# Patient Record
Sex: Female | Born: 1935 | Race: White | Hispanic: No | Marital: Married | Smoking: Never smoker
Health system: Southern US, Community
[De-identification: ages and names within clinical notes are randomized; demographics above are authoritative.]

## PROBLEM LIST (undated history)

## (undated) DIAGNOSIS — M810 Age-related osteoporosis without current pathological fracture: Secondary | ICD-10-CM

## (undated) DIAGNOSIS — H269 Unspecified cataract: Secondary | ICD-10-CM

## (undated) DIAGNOSIS — E785 Hyperlipidemia, unspecified: Secondary | ICD-10-CM

## (undated) DIAGNOSIS — I359 Nonrheumatic aortic valve disorder, unspecified: Secondary | ICD-10-CM

## (undated) DIAGNOSIS — M1990 Unspecified osteoarthritis, unspecified site: Secondary | ICD-10-CM

## (undated) DIAGNOSIS — K5730 Diverticulosis of large intestine without perforation or abscess without bleeding: Secondary | ICD-10-CM

## (undated) DIAGNOSIS — I10 Essential (primary) hypertension: Secondary | ICD-10-CM

## (undated) HISTORY — DX: Diverticulosis of large intestine without perforation or abscess without bleeding: K57.30

## (undated) HISTORY — DX: Nonrheumatic aortic valve disorder, unspecified: I35.9

## (undated) HISTORY — DX: Hyperlipidemia, unspecified: E78.5

## (undated) HISTORY — DX: Unspecified cataract: H26.9

## (undated) HISTORY — PX: HIP FRACTURE SURGERY: SHX118

## (undated) HISTORY — DX: Age-related osteoporosis without current pathological fracture: M81.0

## (undated) HISTORY — DX: Unspecified osteoarthritis, unspecified site: M19.90

---

## 2004-08-15 ENCOUNTER — Encounter: Payer: Self-pay | Admitting: Cardiology

## 2004-11-07 ENCOUNTER — Encounter: Payer: Self-pay | Admitting: Cardiology

## 2007-09-04 ENCOUNTER — Encounter: Payer: Self-pay | Admitting: Cardiology

## 2010-01-03 ENCOUNTER — Encounter: Payer: Self-pay | Admitting: Cardiology

## 2010-01-11 DIAGNOSIS — I359 Nonrheumatic aortic valve disorder, unspecified: Secondary | ICD-10-CM | POA: Insufficient documentation

## 2010-01-11 DIAGNOSIS — H269 Unspecified cataract: Secondary | ICD-10-CM

## 2010-01-11 DIAGNOSIS — K5730 Diverticulosis of large intestine without perforation or abscess without bleeding: Secondary | ICD-10-CM | POA: Insufficient documentation

## 2010-01-11 DIAGNOSIS — M810 Age-related osteoporosis without current pathological fracture: Secondary | ICD-10-CM | POA: Insufficient documentation

## 2010-01-11 DIAGNOSIS — I10 Essential (primary) hypertension: Secondary | ICD-10-CM | POA: Insufficient documentation

## 2010-01-11 DIAGNOSIS — M1990 Unspecified osteoarthritis, unspecified site: Secondary | ICD-10-CM | POA: Insufficient documentation

## 2010-01-11 DIAGNOSIS — E785 Hyperlipidemia, unspecified: Secondary | ICD-10-CM | POA: Insufficient documentation

## 2010-01-12 ENCOUNTER — Ambulatory Visit: Payer: Self-pay | Admitting: Cardiology

## 2010-01-12 DIAGNOSIS — R0602 Shortness of breath: Secondary | ICD-10-CM | POA: Insufficient documentation

## 2010-01-24 ENCOUNTER — Encounter (HOSPITAL_COMMUNITY)
Admission: RE | Admit: 2010-01-24 | Discharge: 2010-04-07 | Payer: Self-pay | Source: Home / Self Care | Attending: Cardiology | Admitting: Cardiology

## 2010-01-24 ENCOUNTER — Encounter: Payer: Self-pay | Admitting: Cardiology

## 2010-01-24 ENCOUNTER — Ambulatory Visit: Payer: Self-pay | Admitting: Cardiology

## 2010-01-24 ENCOUNTER — Ambulatory Visit: Payer: Self-pay

## 2010-01-24 ENCOUNTER — Ambulatory Visit (HOSPITAL_COMMUNITY): Admission: RE | Admit: 2010-01-24 | Discharge: 2010-01-24 | Payer: Self-pay | Admitting: Cardiology

## 2010-03-26 ENCOUNTER — Inpatient Hospital Stay (HOSPITAL_COMMUNITY): Admission: EM | Admit: 2010-03-26 | Discharge: 2010-03-31 | Payer: Self-pay | Source: Home / Self Care

## 2010-03-27 ENCOUNTER — Encounter (INDEPENDENT_AMBULATORY_CARE_PROVIDER_SITE_OTHER): Payer: Self-pay

## 2010-04-30 NOTE — Consult Note (Signed)
NAMEALLISON, [name]              ACCOUNT NO.:  [id number]  MEDICAL RECORD NO.:  [id number]          PATIENT TYPE:  INP  LOCATION:  3728                         FACILITY:  [facility]  PHYSICIAN:  Valetta Fuller, M.D.  DATE OF BIRTH:  [birth year]  DATE OF CONSULTATION:  03/27/2010 DATE OF DISCHARGE:                                CONSULTATION   CHIEF COMPLAINT:  Hydronephrosis, bladder thickening, and pelvis floor prolapse seen on CT.  HISTORY OF PRESENT ILLNESS:  This is an 77 year old female that was admitted on March 26, 2010 with a history of diverticular bleeding 4- 5 years ago prior to moving to [place].  She has a history of paroxysmal atrial fibrillation but is not on Coumadin.  She presented to the emergency room with 2 episodes of bright red blood per rectum without stools.  She denies any abdominal pain, cramping with her stools.  States that her stools have been normal lately.  She denies chest pain, shortness of breath, headache, dizziness, fever, or urinary voiding symptoms.  She has had 2 colonoscopies in the past, last one was 3 years ago and found to be normal per the patient.  PAST MEDICAL HISTORY: 1. Paroxysmal atrial fibrillation.  She is followed by Dr. Jens Som.     The patient is not on Coumadin but does take aspirin daily. 2. Diverticulitis. 3. Diverticular bleed. 4. Hypertension. 5. Hyperlipidemia.  PAST SURGICAL HISTORY:  Significant for knee surgery.  HOME MEDICATIONS:  Aspirin, amlodipine, and pravastatin.  ALLERGIES:  She denies any drug allergies.  REVIEW OF SYSTEMS:  Significant only for the rectal bleeding on admission to the emergency room.  All other systems were reviewed with the patient and were negative.  SOCIAL HISTORY:  Negative for tobacco, negative for drug.  She does drink two glasses of wine daily.  She denies any exposure to tobacco or chemical exposure.  She lives at home and cares for her husband and she is a full  code.  FAMILY HISTORY:  Significant for colon cancer in her mother and she has two sons and a granddaughter with Crohn's.  PHYSICAL EXAMINATION:  VITAL SIGNS:  Blood pressure is 111/71, heart rate 64, respirations 18, temperature 98.4, and room air sats 98%. GENERAL:  Thin, well-developed white elderly female in no acute distress. HEENT:  Atraumatic and normocephalic. NECK:  Supple.  No masses or JVD. CHEST:  Normal. LUNGS:  Clear to auscultation. HEART:  Irregularly regular rhythm.  She has a 2-3 grade/6 systolic murmur. ABDOMEN:  Soft, nontender, positive bowel sounds x4 without CVA tenderness. EXTREMITIES:  No clubbing, cyanosis, or edema. GU: Pelvic organ prolapse. RECTAL:  Deferred. SKIN:  Normal to inspection.  LABORATORY DATA:  CBC:  WBC is 6.6, hemoglobin is 10.0, hematocrit 30.6, and platelets 244.  BMET: Sodium is 139, potassium 3.6, chloride 104, CO2 is 25, BUN is 10, creatinine 0.6, and glucose is 89.  CT abdomen and pelvis shows extensive diffuse diverticulosis involving the entirety of the colon with minimal associated inflammation of proximal sigmoid colon involving a few diverticula, could represent minimal diverticulitis.  Large stool-filled 2.4 cm diverticulum at the distal sigmoid  colon demonstrates mild information, apparent pelvic floor prolapse with protrusion of the periurethral structures, mildly irregular thickening at the bladder base of the bladder with bilateral hydronephrosis, left greater than right raises questions for malignancy.  ASSESSMENT AND PLAN: 1. Pelvic organ prolapse.  Attempt to replace bladder during vaginal     exam without difficulty, I was unsure if this was going to stay     once the patient stands or strains for bowel movement.  Recommend     gynecologist place a pessary for pelvic floor support. 2. Bilateral hydronephrosis, left greater than right.  Recommend     follow up in our office with Dr. Isabel Caprice with a renal  ultrasound. 3. Irregular thickening in bladder base.  We will send urine for     cytology and follow up in our office for flexible cystoscopy with     Dr. Isabel Caprice.     Jetta Lout, NP   ______________________________ Valetta Fuller, M.D.    DW/MEDQ  D:  03/27/2010  T:  03/28/2010  Job:  [id number]  Electronically Signed by Jetta Lout NP on 03/28/2010 01:19:38 PM Electronically Signed by Barron Alvine M.D. on 04/30/2010 09:20:57 AM

## 2010-05-08 NOTE — Letter (Signed)
Summary: [facility]  [facility]   Imported By: Marylou Mccoy 01/26/2010 14:41:16  _____________________________________________________________________  External Attachment:    Type:   Image     Comment:   External Document

## 2010-05-08 NOTE — Assessment & Plan Note (Signed)
Summary: Cardiology Nuclear Testing  Nuclear Med Background Indications for Stress Test: Evaluation for Ischemia   History: Echo  History Comments: Mild AS  Symptoms: DOE, Palpitations, SOB    Nuclear Pre-Procedure Cardiac Risk Factors: Family History - CAD, Hypertension, Lipids Caffeine/Decaff Intake: None NPO After: 6:30 PM Lungs: clear IV 0.9% NS with Angio Cath: 22g     IV Site: R Antecubital IV Started by: Bonnita Levan, RN Chest Size (in) 34     Cup Size B     Height (in): 64 Weight (lb): 120 BMI: 20.67 Tech Comments: In recovery this patient had a hypotensive episode from the [place]. Pt placed in trendelenburg for 5 minutes and her blood pressure came up to 115/48.  Nuclear Med Study 1 or 2 day study:  1 day     Stress Test Type:  Eugenie Birks Reading MD:  Willa Rough, MD     Referring MD:  B.Crenshaw Resting Radionuclide:  Technetium 82m Tetrofosmin     Resting Radionuclide Dose:  11 mCi  Stress Radionuclide:  Technetium 27m Tetrofosmin     Stress Radionuclide Dose:  33 mCi   Stress Protocol   Lexiscan: 0.4 mg   Stress Test Technologist:  Milana Na, EMT-P     Nuclear Technologist:  Doyne Keel, CNMT  Rest Procedure  Myocardial perfusion imaging was performed at rest 45 minutes following the intravenous administration of Technetium 67m Tetrofosmin.  Stress Procedure  The patient received IV Lexiscan 0.4 mg over 15-seconds.  Technetium 46m Tetrofosmin injected at 30-seconds.  There were no significant changes and rare pacs/pvcs with infusion.  Quantitative spect images were obtained after a 45 minute delay.  QPS Raw Data Images:  Patient motion noted; appropriate software correction applied. Stress Images:  Normal homogeneous uptake in all areas of the myocardium. Rest Images:  Normal homogeneous uptake in all areas of the myocardium. Subtraction (SDS):  No evidence of ischemia. Transient Ischemic Dilatation:  1.10  (Normal <1.22)  Lung/Heart Ratio:  0.34   (Normal <0.45)  Quantitative Gated Spect Images QGS EDV:  86 ml QGS ESV:  25 ml QGS EF:  71 % QGS cine images:  Wall motion normal.  Findings Normal nuclear study      Overall Impression  Exercise Capacity: Lexiscan with no exercise. BP Response: There was hypotension related to Lexiscan.  Clinical Symptoms: Light-headed when BP down...resolved completely ECG Impression: No significant ST segment change suggestive of ischemia. Overall Impression: Normal stress nuclear study. Hypotension from Lexiscan resolved.  Appended Document: Cardiology Nuclear Testing ok  Appended Document: Cardiology Nuclear Testing pt aware of results

## 2010-05-08 NOTE — Letter (Signed)
Summary: [facility] - Echo  [facility] - Echo   Imported By: Marylou Mccoy 01/26/2010 14:42:43  _____________________________________________________________________  External Attachment:    Type:   Image     Comment:   External Document

## 2010-05-08 NOTE — Assessment & Plan Note (Signed)
Summary: NP6/MVP/INCREASED IN SOB   CC:  sob.  History of Present Illness: 77 year old female for evaluation of dyspnea. Patient apparently had an echocardiogram in May of 2009. This showed normal LV function. The aortic bowel was calcified and there was mild aortic stenosis with a peak velocity of 2.2 m/s and a mean gradient of 13 mm of mercury. There was mild mitral regurgitation. There was grade 1 diastolic dysfunction. Because of her dyspnea we were asked to further evaluate. The patient has dyspnea on exertion for the past year. It is relieved with rest. There is no orthopnea, PND, palpitations, syncope or chest pain. She occasionally has mild edema in the left lower extremity which is a chronic issue.  Current Medications (verified): 1)  Amlodipine Besylate 10 Mg Tabs (Amlodipine Besylate) .... Take One Tablet By Mouth Daily 2)  Cod Liver Oil  Oil (Cod Liver Oil) .... Daily 3)  A Thru Z Advanced  Tabs (Multiple Vitamins-Minerals) .Marland Kitchen.. 1 Tab By Mouth Once Daily 4)  Calcium 600-D 600-400 Mg-Unit Tabs (Calcium Carbonate-Vitamin D) .Marland Kitchen.. 1 Tab By Mouth Once Daily 5)  Glucosamine-Chondroitin   Caps (Glucosamine-Chondroit-Vit C-Mn) .Marland Kitchen.. 1 Tab By Mouth Once Daily  Past History:  Past Medical History: AORTIC STENOSIS  BORDERLINE HYPERLIPIDEMIA HYPERTENSION CATARACTS DIVERTICULAR DISEASE OSTEOARTHRITIS OSTEOPOROSIS   Past Surgical History: Knee replacement (left) Cataract Extraction Tonsillectomy  Family History: Reviewed history from 01/11/2010 and no changes required. Mother: Colon cancer Brother(1) lung cancer Brother(2) Brain Tumor Son died of MI at age 63  Social History: Reviewed history from 01/11/2010 and no changes required. Married  Alcohol Use - 2 glasses of wine per day Drug Use - no  Review of Systems       no fevers or chills, productive cough, hemoptysis, dysphasia, odynophagia, melena, hematochezia, dysuria, hematuria, rash, seizure activity, orthopnea, PND,  pedal edema, claudication. Remaining systems are negative.   Vital Signs:  Patient profile:   76 year old female Height:      64 inches Weight:      118 pounds BMI:     20.33 Pulse rate:   83 / minute Resp:     14 per minute BP sitting:   159 / 68  (left arm)  Vitals Entered By: Kem Parkinson (January 12, 2010 10:33 AM)  Physical Exam  General:  Well developed/well nourished in NAD Skin warm/dry Patient not depressed No peripheral clubbing Back-normal HEENT-normal/normal eyelids Neck supple/normal carotid upstroke bilaterally; no bruits; no JVD; no thyromegaly chest - CTA/ normal expansion CV - RRR/normal S1 and S2; no  rubs or gallops;  PMI nondisplaced, 2/6 systolic murmur left sternal border. S2 is not diminished. Abdomen -NT/ND, no HSM, no mass, + bowel sounds, no bruit 2+ femoral pulses, no bruits Ext-no edema, chords, 2+ DP Neuro-grossly nonfocal     EKG  Procedure date:  01/12/2010  Findings:      catheter sinus rhythm at a rate of 83. Left axis deviation. No significant ST changes. Poor R-wave progression.  Impression & Recommendations:  Problem # 1:  AORTIC STENOSIS (ICD-424.1)  Etiology of dyspnea is not clear. She is not volume overloaded on examination. Her aortic stenosis does not sound severe on examination. Schedule echocardiogram to quantify LV function and aortic stenosis. She may require aortic valve replacement in the future. Add enteric-coated aspirin 81 mg p.o. daily.  Orders: Echocardiogram (Echo)  Problem # 2:  SHORTNESS OF BREATH (ICD-786.05) As above schedule echocardiogram. We'll also schedule Myoview to exclude ischemia. Her updated medication  list for this problem includes:    Amlodipine Besylate 10 Mg Tabs (Amlodipine besylate) .Marland Kitchen... Take one tablet by mouth daily  Orders: Nuclear Stress Test (Nuc Stress Test)  Problem # 3:  HYPERLIPIDEMIA (ICD-272.4) Will have recent lipids forwarded to [place] for records. She may require a  statin given her aortic stenosis if LDL significantly elevated.  Problem # 4:  HYPERTENSION (ICD-401.9) Blood pressure mildly elevated. I will follow this and increase medications as needed. Her updated medication list for this problem includes:    Amlodipine Besylate 10 Mg Tabs (Amlodipine besylate) .Marland Kitchen... Take one tablet by mouth daily  Patient Instructions: 1)  Your physician recommends that you schedule a follow-up appointment in: 6 MONTHS 2)  Your physician has requested that you have an echocardiogram.  Echocardiography is a painless test that uses sound waves to create images of your heart. It provides your doctor with information about the size and shape of your heart and how well your heart's chambers and valves are working.  This procedure takes approximately one hour. There are no restrictions for this procedure. 3)  Your physician has requested that you have an exercise stress myoview.  For further information please visit [email or web address]  Please follow instruction sheet, as given.

## 2010-05-08 NOTE — Consult Note (Signed)
Summary: [facility]   Imported By: Earl Many 03/12/2010 16:49:06  _____________________________________________________________________  External Attachment:    Type:   Image     Comment:   Consult Report

## 2010-05-08 NOTE — Letter (Signed)
Summary: [facility] - Echo  [facility] - Echo   Imported By: Marylou Mccoy 01/26/2010 14:42:05  _____________________________________________________________________  External Attachment:    Type:   Image     Comment:   External Document

## 2010-05-24 ENCOUNTER — Inpatient Hospital Stay (HOSPITAL_COMMUNITY)
Admission: EM | Admit: 2010-05-24 | Discharge: 2010-05-29 | DRG: 481 | Disposition: A | Discharge status: Skilled Nursing Facility | Payer: Medicare Other | Attending: Orthopedic Surgery | Admitting: Orthopedic Surgery

## 2010-05-24 ENCOUNTER — Inpatient Hospital Stay (HOSPITAL_COMMUNITY): Payer: Medicare Other

## 2010-05-24 ENCOUNTER — Emergency Department (HOSPITAL_COMMUNITY): Payer: Medicare Other

## 2010-05-24 DIAGNOSIS — W06XXXA Fall from bed, initial encounter: Secondary | ICD-10-CM | POA: Diagnosis present

## 2010-05-24 DIAGNOSIS — I10 Essential (primary) hypertension: Secondary | ICD-10-CM | POA: Diagnosis present

## 2010-05-24 DIAGNOSIS — S72143A Displaced intertrochanteric fracture of unspecified femur, initial encounter for closed fracture: Principal | ICD-10-CM | POA: Diagnosis present

## 2010-05-24 DIAGNOSIS — Y998 Other external cause status: Secondary | ICD-10-CM

## 2010-05-24 DIAGNOSIS — N1330 Unspecified hydronephrosis: Secondary | ICD-10-CM | POA: Diagnosis present

## 2010-05-24 DIAGNOSIS — Y92009 Unspecified place in unspecified non-institutional (private) residence as the place of occurrence of the external cause: Secondary | ICD-10-CM

## 2010-05-24 DIAGNOSIS — S78119A Complete traumatic amputation at level between unspecified hip and knee, initial encounter: Secondary | ICD-10-CM

## 2010-05-24 LAB — CBC
Hemoglobin: 12.9 g/dL (ref 12.0–15.0)
MCH: 31.9 pg (ref 26.0–34.0)
Platelets: 244 10*3/uL (ref 150–400)
RBC: 4.05 MIL/uL (ref 3.87–5.11)
RDW: 12.6 % (ref 11.5–15.5)
WBC: 5.9 10*3/uL (ref 4.0–10.5)

## 2010-05-24 LAB — TYPE AND SCREEN
ABO/RH(D): A POS
Antibody Screen: NEGATIVE

## 2010-05-24 LAB — BASIC METABOLIC PANEL
CO2: 28 mEq/L (ref 19–32)
Calcium: 9 mg/dL (ref 8.4–10.5)
GFR calc Af Amer: >60 mL/min (ref >60)
Potassium: 4.2 mEq/L (ref 3.5–5.1)
Sodium: 136 mEq/L (ref 135–145)

## 2010-05-24 LAB — DIFFERENTIAL
Eosinophils Absolute: 0.2 10*3/uL (ref 0.0–0.7)
Eosinophils Relative: 3 % (ref 0–5)
Lymphs Abs: 1.7 10*3/uL (ref 0.7–4.0)
Neutro Abs: 3.2 10*3/uL (ref 1.7–7.7)
Neutrophils Relative %: 55 % (ref 43–77)

## 2010-05-24 LAB — MRSA PCR SCREENING: MRSA by PCR: NEGATIVE

## 2010-05-24 LAB — PROTIME-INR
INR: 0.99 (ref 0.00–1.49)
Prothrombin Time: 13.3 seconds (ref 11.6–15.2)

## 2010-05-25 LAB — CBC
HCT: 31.5 % — ABNORMAL LOW (ref 36.0–46.0)
MCH: 31.3 pg (ref 26.0–34.0)
MCV: 94.9 fL (ref 78.0–100.0)
WBC: 7.3 10*3/uL (ref 4.0–10.5)

## 2010-05-26 LAB — PROTIME-INR: INR: 1.15 (ref 0.00–1.49)

## 2010-05-27 LAB — PROTIME-INR: INR: 1.33 (ref 0.00–1.49)

## 2010-05-28 LAB — HEMOGLOBIN AND HEMATOCRIT, BLOOD: HCT: 32.4 % — ABNORMAL LOW (ref 36.0–46.0)

## 2010-05-28 LAB — PROTIME-INR
INR: 1.64 — ABNORMAL HIGH (ref 0.00–1.49)
Prothrombin Time: 19.6 seconds — ABNORMAL HIGH (ref 11.6–15.2)

## 2010-05-29 ENCOUNTER — Encounter: Payer: Self-pay | Admitting: Internal Medicine

## 2010-05-29 LAB — BASIC METABOLIC PANEL
CO2: 29 mEq/L (ref 19–32)
GFR calc Af Amer: >60 mL/min (ref >60)
GFR calc non Af Amer: >60 mL/min (ref >60)
Potassium: 4.1 mEq/L (ref 3.5–5.1)
Sodium: 135 mEq/L (ref 135–145)

## 2010-05-29 LAB — PROTIME-INR
INR: 1.93 — ABNORMAL HIGH (ref 0.00–1.49)
Prothrombin Time: 22.2 seconds — ABNORMAL HIGH (ref 11.6–15.2)

## 2010-05-31 DIAGNOSIS — I10 Essential (primary) hypertension: Secondary | ICD-10-CM

## 2010-05-31 DIAGNOSIS — E785 Hyperlipidemia, unspecified: Secondary | ICD-10-CM

## 2010-05-31 DIAGNOSIS — M810 Age-related osteoporosis without current pathological fracture: Secondary | ICD-10-CM

## 2010-06-05 NOTE — Letter (Signed)
Summary: [facility]  [facility]   Imported By: Beau Fanny 06/01/2010 15:25:58  _____________________________________________________________________  External Attachment:    Type:   Image     Comment:   External Document

## 2010-06-06 NOTE — H&P (Signed)
[name], [name]              ACCOUNT NO.:  [id number]  MEDICAL RECORD NO.:  [id number]           PATIENT TYPE:  I  LOCATION:  5021                         FACILITY:  [facility]  PHYSICIAN:  Burnard Bunting, M.D.    DATE OF BIRTH:  02-18-1922  DATE OF ADMISSION:  05/24/2010 DATE OF DISCHARGE:  05/24/2010                             HISTORY & PHYSICAL   CHIEF COMPLAINT:  Left hip pain.  HISTORY OF PRESENT ILLNESS:  [name] is an 77 year old female with left hip pain.  She is an ambulatory female, moved here from [place] to be closer with her grandchildren and children.  She lives with her husband who is fairly functional by her report.  She fell this morning without loss of consciousness and reports left hip pain.  Denies any other orthopedic complaints.  She is unable to weight bear.  She has had some relief with morphine provided by the emergency room.  PAST MEDICAL HISTORY:  Notable for hypertension.  PAST SURGICAL HISTORY:  Notable for partial TKA on the left in 2004, vein stripping 40 years ago.  She has no known drug allergies.  CURRENT MEDICATIONS:  Pravachol 40 mg p.o. daily, multivitamins, glucosamine, cod liver oil, calcium, and vitamin D one p.o. daily, amlodipine 10 mg p.o. daily.  The patient has no chest pain, fevers, chills, shortness of breath on review of systems.  All other systems reviewed are negative as they relate to the left hip.  PHYSICAL EXAMINATION:  GENERAL:  She is well developed, well nourished, in no acute distress, alert and oriented.  She has normal body mass index. CHEST:  Clear to auscultation. HEART:  Beats regular rate. ABDOMEN:  Benign. VITAL SIGNS:  Her temperature is 98.5, pulse 93, respiration 18, blood pressure 134/65. EXTREMITIES:  Bilateral upper extremities have full range of motion. NECK:  Full range of motion. HEENT:  She does have a small scalp hematoma.  Extraocular movements intact.  Motor sensory function in upper  extremities is symmetric and intact.  She does have some groin pain with internal and external rotation of the left.  She has a well-healed surgical incision over the left knee.  No effusion is noted here on the left knee.  Right hip, ankle, and knee range of motion is full without grinding or crepitus. She has palpable pedal pulses bilaterally.  Good dorsiflexion, plantar flexion.  Strength bilaterally intact.  Sensation on dorsal plantar aspect of both feet.  Laboratory eval includes sodium 136, potassium 4.2, BUN and creatinine 14 and 0.82.  White count 5.9, hemoglobin 12.9.  Chest x-ray, no acute airspace disease.  EKG, normal sinus rhythm.  X-ray of the hip does show left intertrochanteric fracture.  IMPRESSION:  Left intertrochanteric fracture in an otherwise healthy individual 77 year old female.  No family history of deep vein thrombosis.  Plan is for IMHS fixation to be done later on this afternoon.  The risks and benefits discussed with the patient including but not limited to infection, nonhealing.  Anticipate 2- to 3-day hospital stay. Mobilization of physical therapy after fixation.  Coumadin for DVT prophylaxis following fixation.  Foot pumps for now.  All questions answered.     Burnard Bunting, M.D.     GSD/MEDQ  D:  05/24/2010  T:  05/24/2010  Job:  [id number]  Electronically Signed by Reece Agar.  Ameria Sanjurjo M.D. on 06/06/2010 08:39:33 AM

## 2010-06-06 NOTE — Discharge Summary (Signed)
  NAMEVOLLIE, AARON              ACCOUNT NO.:  [id number]  MEDICAL RECORD NO.:  [id number]           PATIENT TYPE:  I  LOCATION:  5021                         FACILITY:  [facility]  PHYSICIAN:  Burnard Bunting, M.D.    DATE OF BIRTH:  [birth year]  DATE OF ADMISSION:  05/24/2010 DATE OF DISCHARGE:  05/24/2010                              DISCHARGE SUMMARY   DISCHARGE DIAGNOSIS:  Left hip fracture.  SECONDARY DIAGNOSIS:  Hypertension.  OPERATIONS AND PROCEDURES:  Left hip fracture, intramedullary hip screw placement performed May 24, 2010.  HOSPITAL COURSE:  [name] is an 77 year old ambulatory female, who fell on the day of admission.  She underwent intramedullary hip screw placement.  She tolerated the procedure well without immediate complication.  She was started on Coumadin for DVT prophylaxis Lovenox was added after failure to achieve INR after postop day #3.  She mobilized touchdown weightbearing with physical therapy and had good progress.  She was discharged to skilled nurse facility May 29, 2010, in good condition.  She will follow up with me in 10 days for suture removal.  She will continue with touchdown weightbearing and the hip strengthening exercises.  She is discharged in good condition.  DISCHARGE MEDICATIONS: 1. Zocor 20 mg p.o. daily. 2. Multivitamin 1 p.o. daily. 3. Calcium carbonate and vitamin D. 4. Os-Cal 1 tab p.o. daily. 5. Norvasc 10 mg p.o. daily. 6. Coumadin 5 mg p.o. daily to INR 2-2.5. 7. MiraLax 17 g p.o. daily. 8. Robaxin 500 mg q.8 h. p.r.n. spasm. 9. Norco 5/325, one p.o. q.3-4 h. p.r.n. pain.     Burnard Bunting, M.D.     GSD/MEDQ  D:  05/28/2010  T:  05/29/2010  Job:  [id number]  Electronically Signed by Reece Agar.  DEAN M.D. on 06/06/2010 08:39:31 AM

## 2010-06-06 NOTE — Op Note (Signed)
  NAMEKORIN, SETZLER              ACCOUNT NO.:  [id number]  MEDICAL RECORD NO.:  [id number]           PATIENT TYPE:  I  LOCATION:  5021                         FACILITY:  [facility]  PHYSICIAN:  Burnard Bunting, M.D.    DATE OF BIRTH:  01-Oct-1921  DATE OF PROCEDURE:  05/24/2010 DATE OF DISCHARGE:  05/24/2010                              OPERATIVE REPORT   PREOPERATIVE DIAGNOSIS:  Left intertrochanteric hip fracture.  POSTOPERATIVE DIAGNOSIS:  Left intertrochanteric hip fracture.  PROCEDURE:  Left intertrochanteric hip fracture open reduction internal fixation.  SURGEON:  Burnard Bunting, MD  ASSISTANT:  None.  ANESTHESIA:  General endotracheal.  ESTIMATED BLOOD LOSS:  Minimal.  INDICATIONS:  nyimah shadduck is an 77 year old female with left intertrochanteric hip fracture who presents for operative management after explanation of risks and benefits.  IMPLANTS:  Smith and Nephew 10 x 36, intramedullary hip screw, 85-mm lag screw.  PROCEDURE IN DETAIL:  The patient was brought to the operating room where general endotracheal anesthesia was induced.  Preoperative antibiotics were administered.  Time-out was called.  The patient was placed in lithotomy position with the right leg well-padded on the peroneal area, left leg was placed in traction and slight internal rotation.  Fluoroscopy was used to confirm fracture reduction in the AP and lateral planes.  The area was prescribed with alcohol and Betadine and then prepped with DuraPrep solution and draped them with wall drape Ioban to the tip of the trochanter and the version of the head were then noted and marked on the skin and incision was made about a four fingerbreadths proximal to the tip of the trochanter.  Guide pin was placed 2 mm medial to the tip of the trochanter and into the shaft. Proximal reaming was performed in accordance with preoperative templating, 10 x 36 nail was placed.  The lag screw was then placed in the  anteverted neck in the center-center position of the head, 5 mm from the articular surface.  At this time, a compression screw was applied after traction was released and fluoroscopy was used to confirm good placement of the nail, both proximally and distally.  Good reduction of the fracture was achieved.  Outrigger was removed. Incision was thoroughly irrigated, then closed using 0 Vicryl suture, 2- 0 Vicryl suture staple, and Mepilex dressing.  The patient tolerated the procedure well without any immediate complications.  She was transferred to the recovery room in stable condition.     Burnard Bunting, M.D.     GSD/MEDQ  D:  05/24/2010  T:  05/25/2010  Job:  [id number]  Electronically Signed by Reece Agar.  Laderrick Wilk M.D. on 06/06/2010 08:39:39 AM

## 2010-06-18 DIAGNOSIS — I10 Essential (primary) hypertension: Secondary | ICD-10-CM

## 2010-06-18 DIAGNOSIS — S72009A Fracture of unspecified part of neck of unspecified femur, initial encounter for closed fracture: Secondary | ICD-10-CM

## 2010-06-18 LAB — CBC
HCT: 29.4 % — ABNORMAL LOW (ref 36.0–46.0)
HCT: 30.2 % — ABNORMAL LOW (ref 36.0–46.0)
HCT: 30.6 % — ABNORMAL LOW (ref 36.0–46.0)
HCT: 30.6 % — ABNORMAL LOW (ref 36.0–46.0)
HCT: 31 % — ABNORMAL LOW (ref 36.0–46.0)
HCT: 31.9 % — ABNORMAL LOW (ref 36.0–46.0)
HCT: 32 % — ABNORMAL LOW (ref 36.0–46.0)
HCT: 35.6 % — ABNORMAL LOW (ref 36.0–46.0)
Hemoglobin: 10 g/dL — ABNORMAL LOW (ref 12.0–15.0)
Hemoglobin: 10 g/dL — ABNORMAL LOW (ref 12.0–15.0)
Hemoglobin: 10.2 g/dL — ABNORMAL LOW (ref 12.0–15.0)
Hemoglobin: 10.2 g/dL — ABNORMAL LOW (ref 12.0–15.0)
Hemoglobin: 10.3 g/dL — ABNORMAL LOW (ref 12.0–15.0)
Hemoglobin: 10.4 g/dL — ABNORMAL LOW (ref 12.0–15.0)
Hemoglobin: 10.6 g/dL — ABNORMAL LOW (ref 12.0–15.0)
Hemoglobin: 11.7 g/dL — ABNORMAL LOW (ref 12.0–15.0)
MCH: 31.5 pg (ref 26.0–34.0)
MCH: 31.6 pg (ref 26.0–34.0)
MCH: 31.8 pg (ref 26.0–34.0)
MCH: 31.9 pg (ref 26.0–34.0)
MCHC: 32.3 g/dL (ref 30.0–36.0)
MCHC: 32.8 g/dL (ref 30.0–36.0)
MCHC: 33.2 g/dL (ref 30.0–36.0)
MCHC: 33.3 g/dL (ref 30.0–36.0)
MCV: 95.6 fL (ref 78.0–100.0)
MCV: 95.6 fL (ref 78.0–100.0)
MCV: 96.2 fL (ref 78.0–100.0)
MCV: 96.3 fL (ref 78.0–100.0)
MCV: 96.6 fL (ref 78.0–100.0)
MCV: 96.7 fL (ref 78.0–100.0)
MCV: 96.7 fL (ref 78.0–100.0)
Platelets: 247 10*3/uL (ref 150–400)
Platelets: 253 10*3/uL (ref 150–400)
Platelets: 261 10*3/uL (ref 150–400)
Platelets: 264 10*3/uL (ref 150–400)
Platelets: 264 10*3/uL (ref 150–400)
Platelets: 270 10*3/uL (ref 150–400)
RBC: 3.04 MIL/uL — ABNORMAL LOW (ref 3.87–5.11)
RBC: 3.15 MIL/uL — ABNORMAL LOW (ref 3.87–5.11)
RBC: 3.21 MIL/uL — ABNORMAL LOW (ref 3.87–5.11)
RBC: 3.28 MIL/uL — ABNORMAL LOW (ref 3.87–5.11)
RBC: 3.3 MIL/uL — ABNORMAL LOW (ref 3.87–5.11)
RBC: 3.33 MIL/uL — ABNORMAL LOW (ref 3.87–5.11)
RBC: 3.7 MIL/uL — ABNORMAL LOW (ref 3.87–5.11)
RDW: 12.7 % (ref 11.5–15.5)
RDW: 12.8 % (ref 11.5–15.5)
RDW: 12.9 % (ref 11.5–15.5)
RDW: 13 % (ref 11.5–15.5)
RDW: 13 % (ref 11.5–15.5)
RDW: 13.1 % (ref 11.5–15.5)
WBC: 5.7 10*3/uL (ref 4.0–10.5)
WBC: 5.9 10*3/uL (ref 4.0–10.5)
WBC: 5.9 10*3/uL (ref 4.0–10.5)
WBC: 6.6 10*3/uL (ref 4.0–10.5)
WBC: 6.6 10*3/uL (ref 4.0–10.5)
WBC: 6.7 10*3/uL (ref 4.0–10.5)
WBC: 6.8 10*3/uL (ref 4.0–10.5)

## 2010-06-18 LAB — BASIC METABOLIC PANEL
BUN: 11 mg/dL (ref 6–23)
BUN: 8 mg/dL (ref 6–23)
BUN: 9 mg/dL (ref 6–23)
CO2: 24 mEq/L (ref 19–32)
CO2: 25 mEq/L (ref 19–32)
CO2: 27 mEq/L (ref 19–32)
Calcium: 8.7 mg/dL (ref 8.4–10.5)
Chloride: 104 mEq/L (ref 96–112)
Chloride: 105 mEq/L (ref 96–112)
Chloride: 105 mEq/L (ref 96–112)
Creatinine, Ser: 0.7 mg/dL (ref 0.4–1.2)
Creatinine, Ser: 0.71 mg/dL (ref 0.4–1.2)
Creatinine, Ser: 0.82 mg/dL (ref 0.4–1.2)
GFR calc Af Amer: >60 mL/min (ref >60)
GFR calc Af Amer: >60 mL/min (ref >60)
GFR calc non Af Amer: >60 mL/min (ref >60)
GFR calc non Af Amer: >60 mL/min (ref >60)
Glucose, Bld: 102 mg/dL — ABNORMAL HIGH (ref 70–99)
Glucose, Bld: 103 mg/dL — ABNORMAL HIGH (ref 70–99)
Glucose, Bld: 111 mg/dL — ABNORMAL HIGH (ref 70–99)
Glucose, Bld: 89 mg/dL (ref 70–99)
Potassium: 3.6 mEq/L (ref 3.5–5.1)
Potassium: 4 mEq/L (ref 3.5–5.1)
Potassium: 4 mEq/L (ref 3.5–5.1)
Sodium: 139 mEq/L (ref 135–145)
Sodium: 141 mEq/L (ref 135–145)

## 2010-06-18 LAB — PROTIME-INR
INR: 0.97 (ref 0.00–1.49)
Prothrombin Time: 13.1 seconds (ref 11.6–15.2)

## 2010-06-18 LAB — TYPE AND SCREEN: ABO/RH(D): A POS

## 2010-06-18 LAB — CARDIAC PANEL(CRET KIN+CKTOT+MB+TROPI)
CK, MB: 1.2 ng/mL (ref 0.3–4.0)
CK, MB: 1.3 ng/mL (ref 0.3–4.0)
Total CK: 45 U/L (ref 7–177)

## 2010-06-18 LAB — STOOL CULTURE

## 2010-06-18 LAB — MAGNESIUM: Magnesium: 1.9 mg/dL (ref 1.5–2.5)

## 2010-06-18 LAB — CLOSTRIDIUM DIFFICILE BY PCR: Toxigenic C. Difficile by PCR: NEGATIVE

## 2011-01-23 ENCOUNTER — Other Ambulatory Visit: Payer: Self-pay | Admitting: Family Medicine

## 2011-01-29 ENCOUNTER — Other Ambulatory Visit: Payer: Medicare Other

## 2012-02-02 IMAGING — CR DG CHEST 1V PORT
1 series · 1 of 1 positions shown · non-contrast
Comparison: None.

CLINICAL DATA: Possible GI bleed.  Weakness and shortness of
breath.

PORTABLE CHEST - 1 VIEW

[AP]
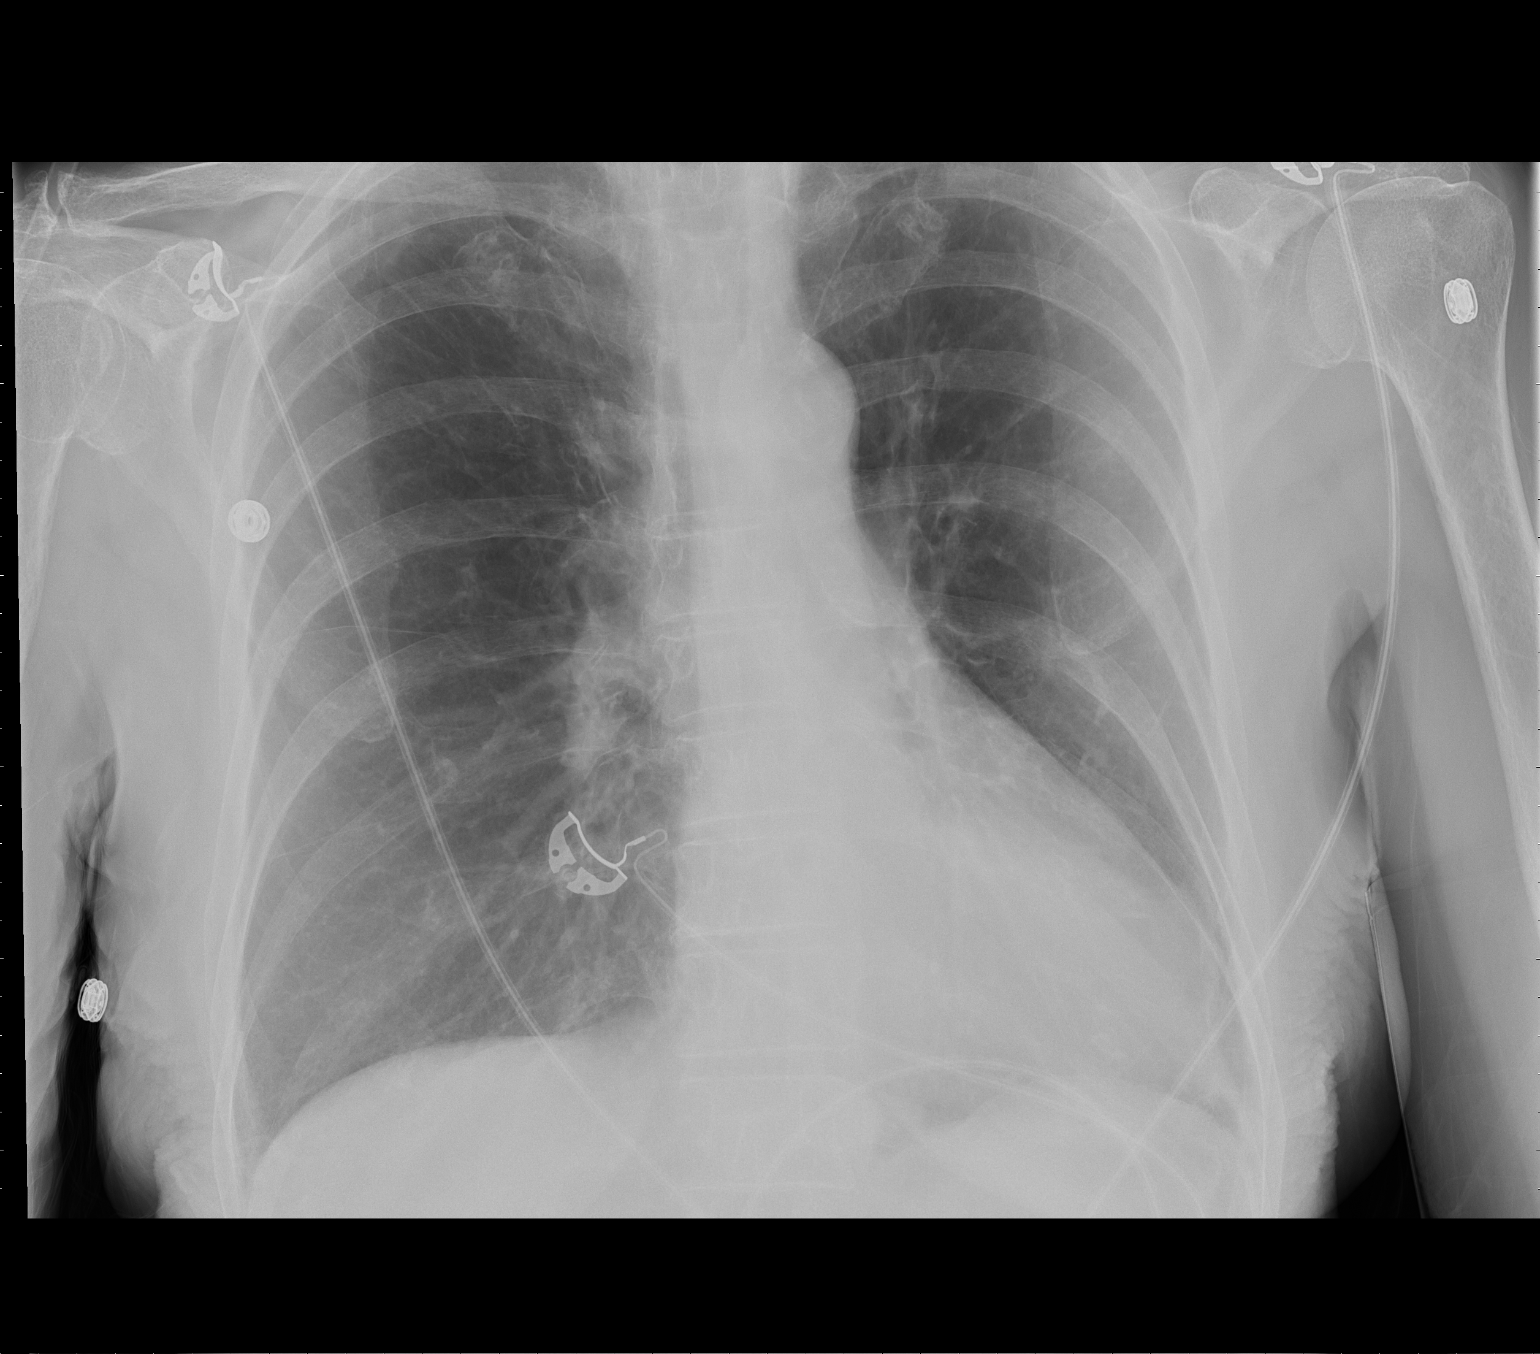

[1 of 1 positions shown; findings below may reference images not displayed]

FINDINGS: The lungs are hyperexpanded with scarring seen at the
lung apices, bilaterally.  Diffuse peribronchial thickening is also
noted.  No confluent airspace opacities, edema or effusions are
seen.  The heart is at the upper limits of normal in size.
Degenerative changes are seen in the AC joints bilaterally.  The
upper abdomen is normal.
IMPRESSION: Hyperexpansion and scarring as detailed above without acute
findings.

## 2012-02-03 IMAGING — CT CT ABD-PELV W/ CM
2 of 3 series · 12 of 32 positions shown, 17 images · IV contrast (water/omni  & 100ml omni 300)
Comparison: None.

CLINICAL DATA: Abdominal discomfort and dark stools; history of
diverticulitis.

CT ABDOMEN AND PELVIS WITH CONTRAST
TECHNIQUE: Multidetector CT imaging of the abdomen and pelvis was
performed following the standard protocol during bolus
administration of intravenous contrast.
Contrast: 100 mL of Omnipaque 300 IV contrast

[Series 2: routine abdomen · axial · 0.70mm/px · z∈[-501,-151]mm · 6 of 99 slices shown, 11 images]
[im 15/99  soft-tissue]
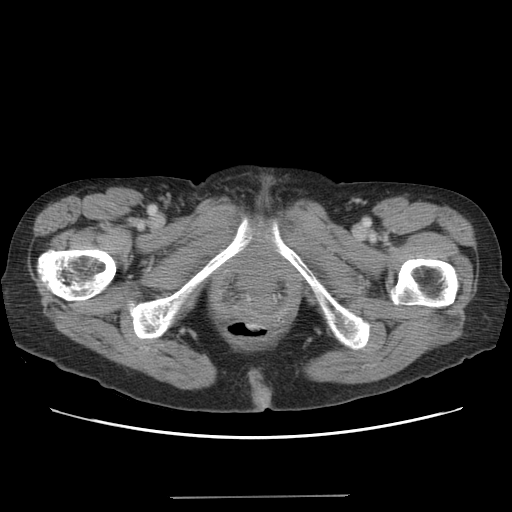
[im 15/99  bone]
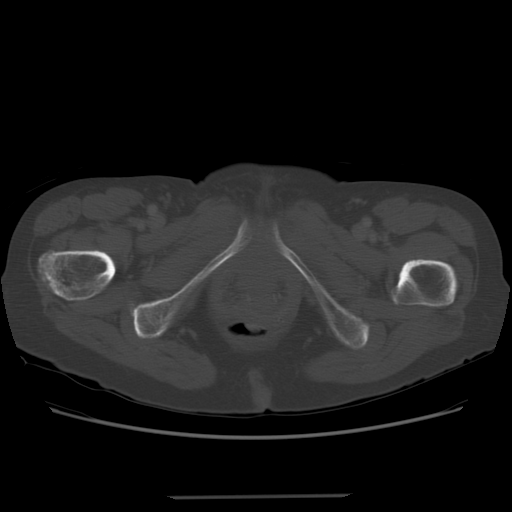
[im 29/99  soft-tissue]
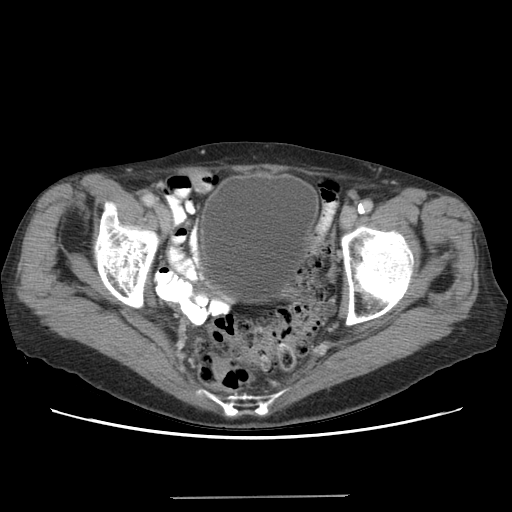
[im 43/99  soft-tissue]
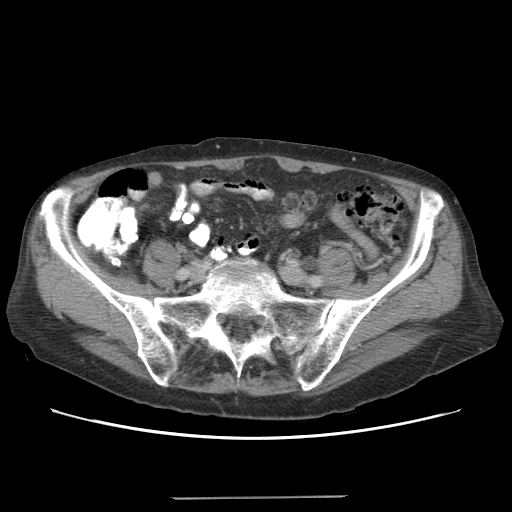
[im 43/99  lung]
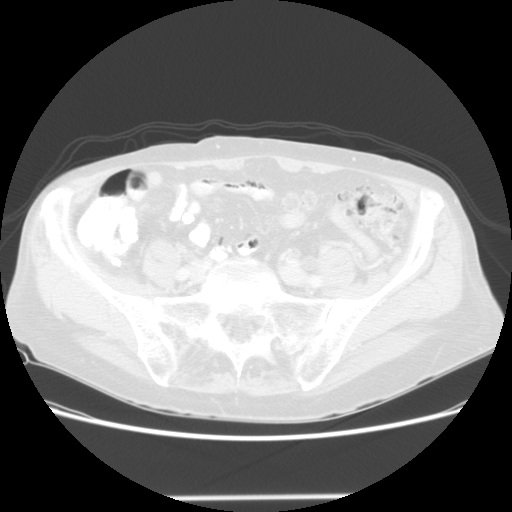
[im 57/99  soft-tissue]
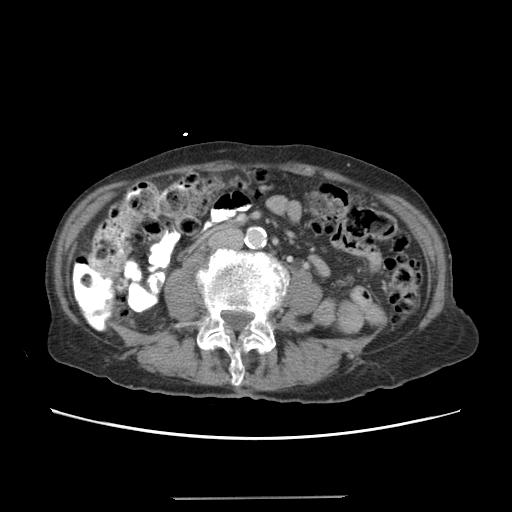
[im 57/99  lung]
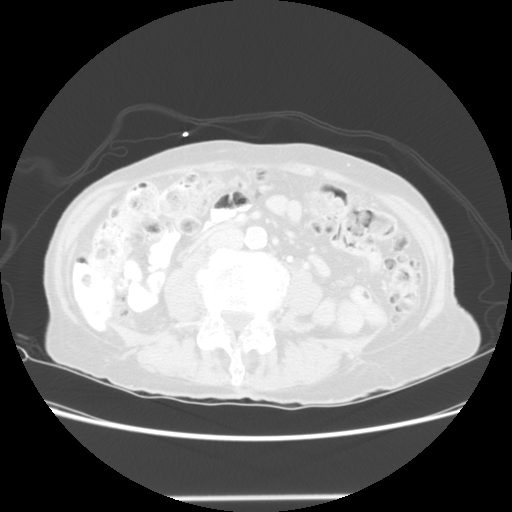
[im 71/99  soft-tissue]
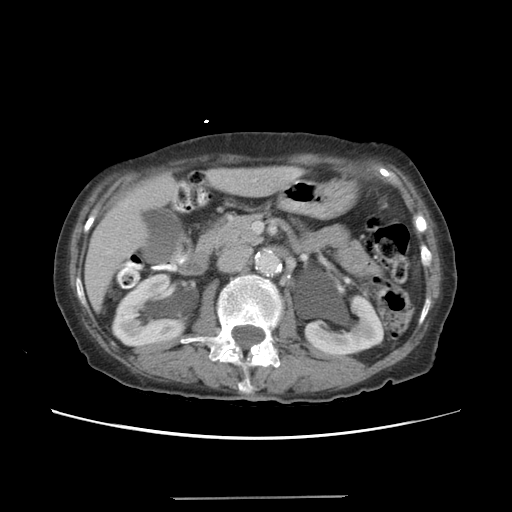
[im 71/99  lung]
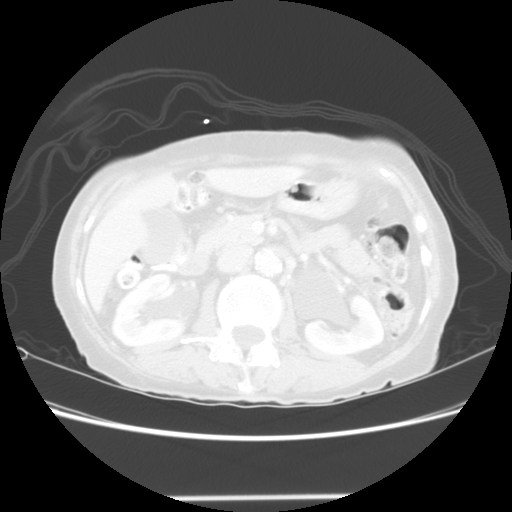
[im 85/99  soft-tissue]
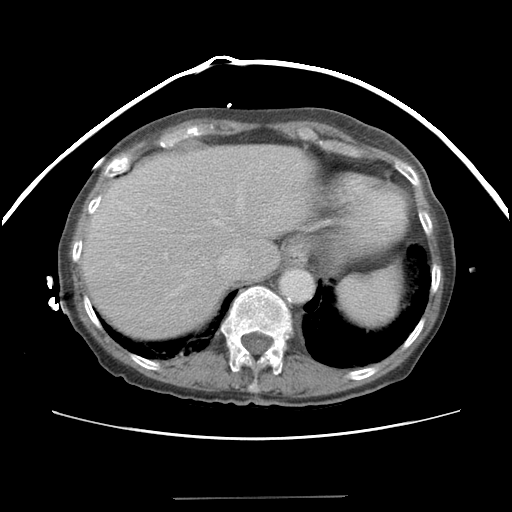
[im 85/99  lung]
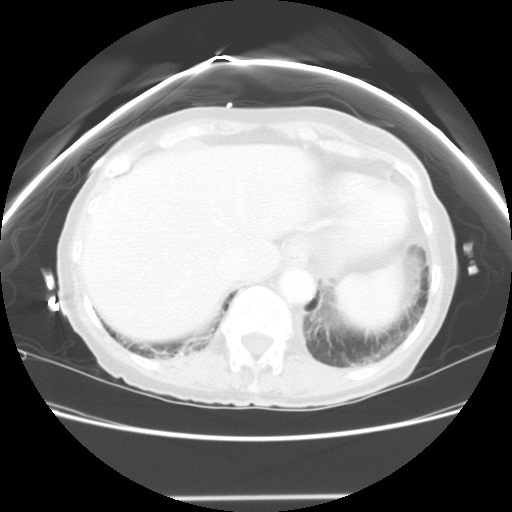

[Series 104: reformatted · sagittal · 0.99mm/px · 6 of 173 slices shown]
[im 14/173  soft-tissue]
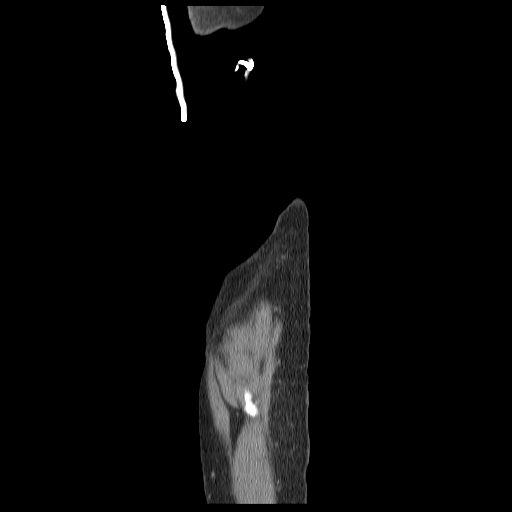
[im 40/173  soft-tissue]
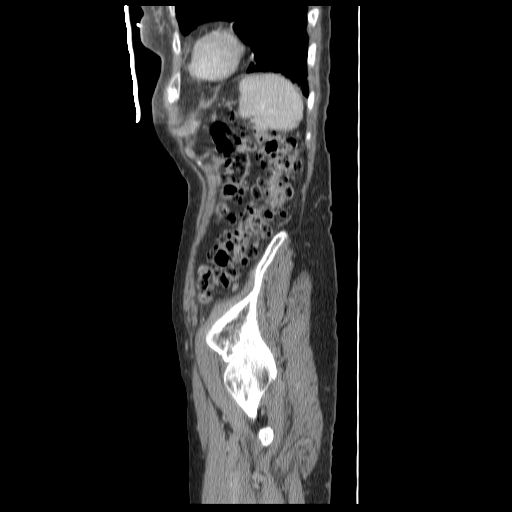
[im 53/173  soft-tissue]
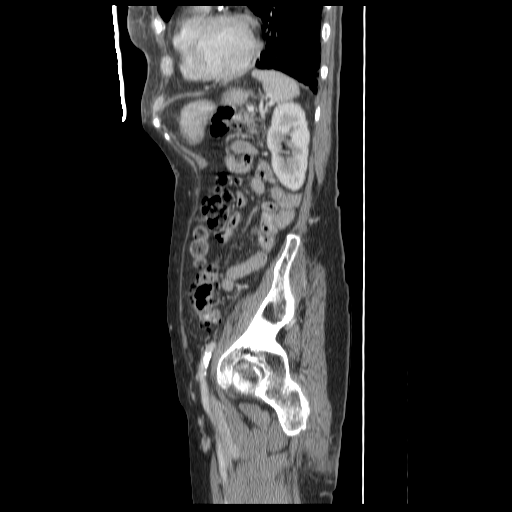
[im 80/173  soft-tissue]
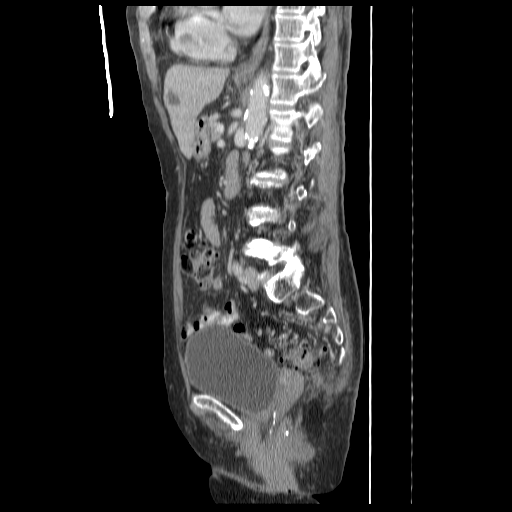
[im 93/173  soft-tissue]
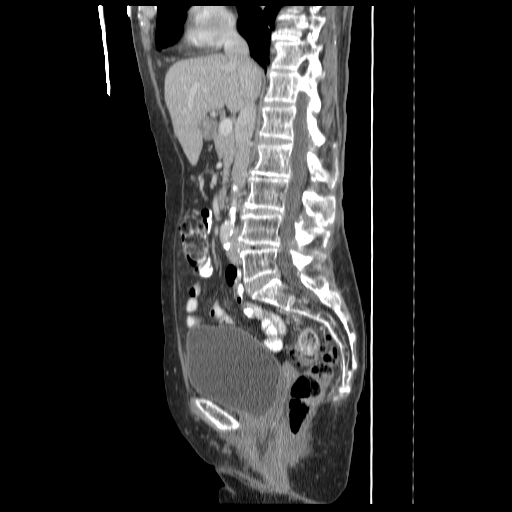
[im 120/173  soft-tissue]
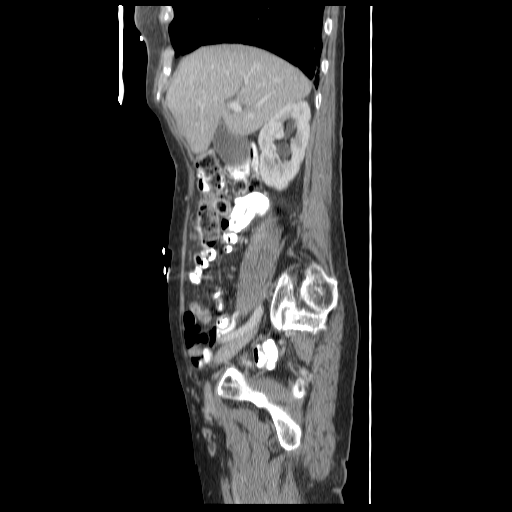

[12 of 32 positions shown; findings below may reference images not displayed]

FINDINGS: Mild bibasilar atelectasis is noted, with associated
scarring.  Coronary artery calcifications are seen.

Multiple hypodensities are noted throughout the liver, more
prominent in the left hepatic lobe; these measure up to 1.9 cm in
size, and most likely reflect cysts.  No suspicious hepatic masses
are characterized.  The spleen is unremarkable in appearance.  The
gallbladder is within normal limits.  The pancreas and adrenal
glands are unremarkable.

Mild bilateral hydronephrosis is noted, left greater than right,
with a prominent left extrarenal pelvis.  There is prominence of
the left ureter along its entire course; no definite distal
obstruction is seen, though there is thickening at the base of the
bladder along the insertion of both ureters.  No renal or ureteral
stones are identified.  No significant perinephric stranding is
seen.

No free fluid is identified.  The small bowel is unremarkable in
appearance.  The stomach is within normal limits.  No acute
vascular abnormalities are seen.  Diffuse calcification is noted
along the abdominal aorta and its branches.

The appendix is normal in caliber and contains air, without
evidence for appendicitis.  There is extensive diffuse
diverticulosis involving nearly the entirety of the colon; the
diverticula are large and numerous.  There is question of minimal
associated inflammation at the proximal sigmoid colon, involving a
few diverticula.  This could reflect minimal diverticulitis.  A
large stool-filled 2.4 cm diverticulum is noted at the distal
sigmoid colon, with mild associated inflammation.

There is apparent prolapse of the pelvic floor, with protrusion of
the periurethral structures.  The bladder is moderately distended;
there is mildly irregular thickening at the base of the bladder.
This raises question for malignancy; suggest clinical correlation.
No inguinal lymphadenopathy is seen.

No acute osseous abnormalities are identified.  There is narrowing
of the intervertebral disc spaces at L2-L3 and L4-L5, with
multilevel mild vacuum phenomenon.
IMPRESSION: 1.  Extensive diffuse diverticulosis involving the entirety of the
colon, with question of minimal associated inflammation at the
proximal sigmoid colon, involving a few diverticula.  This could
reflect minimal diverticulitis.  Large stool-filled 2.4 cm
diverticulum at the distal sigmoid colon demonstrates mild
inflammation.
2.  Apparent pelvic floor prolapse, with protrusion of the
periurethral structures.
3.  Mildly irregular thickening at the base of the bladder, with
mild bilateral hydronephrosis, left greater than right; this raises
question for malignancy.  Suggest clinical correlation.
4.  Diffuse vascular calcifications seen; coronary artery
calcifications noted.
5.  Scattered small hepatic cysts noted.
6.  Mild bibasilar atelectasis, with associated scarring.
7.  Mild degenerative change at the lumbar spine.

## 2012-04-04 ENCOUNTER — Emergency Department (HOSPITAL_COMMUNITY): Payer: Medicare Other

## 2012-04-04 ENCOUNTER — Encounter (HOSPITAL_COMMUNITY): Payer: Self-pay | Admitting: Emergency Medicine

## 2012-04-04 ENCOUNTER — Observation Stay (HOSPITAL_COMMUNITY)
Admission: EM | Admit: 2012-04-04 | Discharge: 2012-04-06 | Disposition: A | Discharge status: Home / Self Care | Payer: Medicare Other | Attending: Internal Medicine | Admitting: Internal Medicine

## 2012-04-04 DIAGNOSIS — M1990 Unspecified osteoarthritis, unspecified site: Secondary | ICD-10-CM

## 2012-04-04 DIAGNOSIS — I359 Nonrheumatic aortic valve disorder, unspecified: Secondary | ICD-10-CM | POA: Insufficient documentation

## 2012-04-04 DIAGNOSIS — K5730 Diverticulosis of large intestine without perforation or abscess without bleeding: Secondary | ICD-10-CM

## 2012-04-04 DIAGNOSIS — E785 Hyperlipidemia, unspecified: Secondary | ICD-10-CM

## 2012-04-04 DIAGNOSIS — R42 Dizziness and giddiness: Secondary | ICD-10-CM | POA: Insufficient documentation

## 2012-04-04 DIAGNOSIS — R55 Syncope and collapse: Principal | ICD-10-CM | POA: Insufficient documentation

## 2012-04-04 DIAGNOSIS — H269 Unspecified cataract: Secondary | ICD-10-CM

## 2012-04-04 DIAGNOSIS — M810 Age-related osteoporosis without current pathological fracture: Secondary | ICD-10-CM

## 2012-04-04 DIAGNOSIS — M19019 Primary osteoarthritis, unspecified shoulder: Secondary | ICD-10-CM | POA: Insufficient documentation

## 2012-04-04 DIAGNOSIS — R112 Nausea with vomiting, unspecified: Secondary | ICD-10-CM | POA: Insufficient documentation

## 2012-04-04 DIAGNOSIS — R0602 Shortness of breath: Secondary | ICD-10-CM

## 2012-04-04 DIAGNOSIS — I10 Essential (primary) hypertension: Secondary | ICD-10-CM | POA: Insufficient documentation

## 2012-04-04 HISTORY — DX: Essential (primary) hypertension: I10

## 2012-04-04 LAB — TROPONIN I: Troponin I: <0.3 ng/mL (ref <0.30)

## 2012-04-04 LAB — POCT I-STAT, CHEM 8
Creatinine, Ser: 1.1 mg/dL (ref 0.50–1.10)
Glucose, Bld: 121 mg/dL — ABNORMAL HIGH (ref 70–99)
HCT: 42 % (ref 36.0–46.0)
Hemoglobin: 14.3 g/dL (ref 12.0–15.0)
Potassium: 4.3 mEq/L (ref 3.5–5.1)
Sodium: 139 mEq/L (ref 135–145)
TCO2: 29 mmol/L (ref 0–100)

## 2012-04-04 LAB — POCT I-STAT TROPONIN I

## 2012-04-04 NOTE — ED Notes (Signed)
Pt to ED via EMS from [facility].  Pt reports she ate dinner then felt like she needed to go to restroom.  Became weak, dizzy as she walked to restroom.  She had a bowel movement and then became nauseous with chills.  Reports she now feels better with mild nausea.

## 2012-04-04 NOTE — ED Provider Notes (Signed)
History     CSN: [id number]  Arrival date & time 04/04/12  7829   First MD Initiated Contact with Patient 04/04/12 1957      Chief Complaint  Patient presents with  . Weakness    generalized  . Nausea  . Dizziness     HPI Pt to ED via EMS from [facility]. Pt reports she ate dinner then felt like she needed to go to restroom. Became weak, dizzy as she walked to restroom. She had a bowel movement and then became nauseous with chills. Reports she now feels better with mild nausea.  Past Medical History  Diagnosis Date  . Hypertension     Past Surgical History  Procedure Date  . Hip fracture surgery     No family history on file.  History  Substance Use Topics  . Smoking status: Never Smoker   . Smokeless tobacco: Not on file  . Alcohol Use: 0.6 oz/week    1 Glasses of wine per week    OB History    Grav Para Term Preterm Abortions TAB SAB Ect Mult Living                  Review of Systems All other systems reviewed and are negative Allergies  Review of patient's allergies indicates no known allergies.  Home Medications   Current Outpatient Rx  Name  Route  Sig  Dispense  Refill  . AMLODIPINE BESYLATE 5 MG PO TABS   Oral   Take 5 mg by mouth daily.           BP 175/67  Pulse 87  Temp 98.2 F (36.8 C) (Oral)  Resp 20  Ht 5\' 4"  (1.626 m)  Wt 120 lb (54.432 kg)  BMI 20.60 kg/m2  SpO2 99%  Physical Exam  Nursing note and vitals reviewed. Constitutional: She is oriented to person, place, and time. She appears well-developed and well-nourished. No distress.  HENT:  Head: Normocephalic and atraumatic.  Eyes: Pupils are equal, round, and reactive to light.  Neck: Normal range of motion.  Cardiovascular: Normal rate and intact distal pulses.   Murmur heard. Pulmonary/Chest: No respiratory distress.  Abdominal: Normal appearance. She exhibits no distension. There is no tenderness. There is no rebound and no guarding.  Musculoskeletal: Normal  range of motion.  Neurological: She is alert and oriented to person, place, and time. No cranial nerve deficit.  Skin: Skin is warm and dry. No rash noted.  Psychiatric: She has a normal mood and affect. Her behavior is normal.    ED Course  Procedures (including critical care time)  Date: 04/04/2012  Rate: 83  Rhythm: normal sinus rhythm  QRS Axis: normal  Intervals: normal  ST/T Wave abnormalities: normal  Conduction Disutrbances: none  Narrative Interpretation: unremarkable     Labs Reviewed - No data to display No results found.   No diagnosis found.    MDM  Hospitalists consulted for rule out admission        Nelia Shi, MD 04/04/12 2221

## 2012-04-04 NOTE — ED Notes (Signed)
Pt Family Contact Information:  Octavia Bruckner (Daughter and Son-in-Law): Home [phone], Cell [phone]  Call Family Upon admission to floor.

## 2012-04-04 NOTE — ED Notes (Signed)
Pt to radiology via stretcher at this time.  

## 2012-04-05 ENCOUNTER — Encounter (HOSPITAL_COMMUNITY): Payer: Self-pay | Admitting: *Deleted

## 2012-04-05 DIAGNOSIS — R55 Syncope and collapse: Secondary | ICD-10-CM

## 2012-04-05 DIAGNOSIS — I359 Nonrheumatic aortic valve disorder, unspecified: Secondary | ICD-10-CM

## 2012-04-05 DIAGNOSIS — R112 Nausea with vomiting, unspecified: Secondary | ICD-10-CM | POA: Diagnosis present

## 2012-04-05 DIAGNOSIS — R6889 Other general symptoms and signs: Secondary | ICD-10-CM

## 2012-04-05 LAB — BASIC METABOLIC PANEL
CO2: 27 mEq/L (ref 19–32)
Calcium: 9.4 mg/dL (ref 8.4–10.5)
Chloride: 101 mEq/L (ref 96–112)
Potassium: 4.2 mEq/L (ref 3.5–5.1)
Sodium: 139 mEq/L (ref 135–145)

## 2012-04-05 LAB — CBC
Hemoglobin: 12.4 g/dL (ref 12.0–15.0)
MCH: 30.9 pg (ref 26.0–34.0)
Platelets: 249 10*3/uL (ref 150–400)
RBC: 4.01 MIL/uL (ref 3.87–5.11)
WBC: 6.6 10*3/uL (ref 4.0–10.5)

## 2012-04-05 MED ORDER — SODIUM CHLORIDE 0.9 % IV SOLN: 250.0000 mL | INTRAVENOUS | Status: DC | PRN

## 2012-04-05 MED ORDER — AMLODIPINE BESYLATE 5 MG PO TABS
5.0000 mg | ORAL_TABLET | Freq: Every day | ORAL | Status: DC
  Administered 2012-04-05 – 2012-04-06 (×2): 5 mg via ORAL
  Filled 2012-04-05 (×3): qty 1

## 2012-04-05 MED ORDER — OXYBUTYNIN 3.9 MG/24HR TD PTTW
1.0000 | MEDICATED_PATCH | TRANSDERMAL | Status: DC
  Administered 2012-04-06: 1 via TRANSDERMAL
  Filled 2012-04-05: qty 1

## 2012-04-05 MED ORDER — OXYBUTYNIN 3.9 MG/24HR TD PTTW
1.0000 | MEDICATED_PATCH | TRANSDERMAL | Status: DC
  Filled 2012-04-05: qty 1

## 2012-04-05 MED ORDER — SODIUM CHLORIDE 0.9 % IJ SOLN
3.0000 mL | Freq: Two times a day (BID) | INTRAMUSCULAR | Status: DC
  Administered 2012-04-05 (×2): 3 mL via INTRAVENOUS

## 2012-04-05 MED ORDER — ADULT MULTIVITAMIN W/MINERALS CH
1.0000 | ORAL_TABLET | Freq: Every day | ORAL | Status: DC
  Administered 2012-04-05 – 2012-04-06 (×2): 1 via ORAL
  Filled 2012-04-05 (×2): qty 1

## 2012-04-05 MED ORDER — SODIUM CHLORIDE 0.9 % IJ SOLN: 3.0000 mL | INTRAMUSCULAR | Status: DC | PRN

## 2012-04-05 MED ORDER — SODIUM CHLORIDE 0.9 % IJ SOLN
3.0000 mL | Freq: Two times a day (BID) | INTRAMUSCULAR | Status: DC
  Administered 2012-04-05 – 2012-04-06 (×2): 3 mL via INTRAVENOUS

## 2012-04-05 MED ORDER — HEPARIN SODIUM (PORCINE) 5000 UNIT/ML IJ SOLN
5000.0000 [IU] | Freq: Three times a day (TID) | INTRAMUSCULAR | Status: DC
  Administered 2012-04-05 (×3): 5000 [IU] via SUBCUTANEOUS
  Filled 2012-04-05 (×8): qty 1

## 2012-04-05 NOTE — H&P (Signed)
[facility] History and Physical  Malesha Suliman NWG:[id number] DOB: 1921-12-23 DOA: 04/04/2012  Referring physician: ED PCP: Tillman Abide, MD  Specialists: None  Chief Complaint: Presyncope  HPI: [name] is a 78 y.o. female who was feeling well until just after she [place].  She went to the bathroom but after she got there she had significant nausea while having a BM on the toilet.  After this she was too dizzy to be able to get back up she says though she did not actually loose consciousness.  As a result of this she came to the ED.  After arrival to the ED she had 1 episode of actual vomiting after which she felt much better and has felt better since that time.  Because of her symptoms, hospitalist has been asked to admit as a rule out cardiac.  Review of Systems: 12 systems reviewed and otherwise negative.  Past Medical History  Diagnosis Date  . Hypertension    Past Surgical History  Procedure Date  . Hip fracture surgery    Social History:  reports that she has never smoked. She does not have any smokeless tobacco history on file. She reports that she drinks about .6 ounces of alcohol per week. She reports that she does not use illicit drugs.   No Known Allergies  No family history on file.  Prior to Admission medications   Medication Sig Start Date End Date Taking? Authorizing Provider  amLODipine (NORVASC) 5 MG tablet Take 5 mg by mouth daily.   Yes Historical Provider, MD  COD LIVER OIL PO Take 1 tablet by mouth daily.   Yes Historical Provider, MD  Glucosamine Sulfate (FP GLUCOSAMINE PO) Take 1 tablet by mouth daily.   Yes Historical Provider, MD  Multiple Vitamin (MULTIVITAMIN WITH MINERALS) TABS Take 1 tablet by mouth daily. [employer] A-Z formula   Yes Historical Provider, MD  Oxybutynin (OXYTROL FOR WOMEN TD) Place 1 patch onto the skin once a week. Change patch every 4 days   Yes Historical Provider,  MD   Physical Exam: Filed Vitals:   04/05/12 0015 04/05/12 0045 04/05/12 0115 04/05/12 0145  BP: 124/64 122/61 124/65 106/53  Pulse: 89 90 85 77  Temp:      TempSrc:      Resp: 33 19 17 18   Height:      Weight:      SpO2: 96% 96% 96% 97%    General:  NAD, resting comfortably in bed Eyes: PEERLA EOMI ENT: mucous membranes moist Neck: supple w/o JVD Cardiovascular: RRR 3/6 SEM audible one exam, over upper chest, not really able to appreciate the radiation to carotids though patient has known h/o AS Respiratory: CTA B Abdomen: soft, nt, nd, bs+ Skin: no rash nor lesion Musculoskeletal: MAE, full ROM all 4 extremities Psychiatric: normal tone and affect Neurologic: AAOx3, grossly non-focal  Labs on Admission:  Basic Metabolic Panel:  Lab 04/04/12 5784  NA 139  K 4.3  CL 102  CO2 --  GLUCOSE 121*  BUN 21  CREATININE 1.10  CALCIUM --  MG --  PHOS --   Liver Function Tests: No results found for this basename: AST:5,ALT:5,ALKPHOS:5,BILITOT:5,PROT:5,ALBUMIN:5 in the last 168 hours No results found for this basename: LIPASE:5,AMYLASE:5 in the last 168 hours No results found for this basename: AMMONIA:5 in the last 168 hours CBC:  Lab 04/04/12 2034  WBC --  NEUTROABS --  HGB 14.3  HCT 42.0  MCV --  PLT --   Cardiac Enzymes:  Lab 04/04/12 2219  CKTOTAL --  CKMB --  CKMBINDEX --  TROPONINI <0.30    BNP (last 3 results) No results found for this basename: PROBNP:3 in the last 8760 hours CBG: No results found for this basename: GLUCAP:5 in the last 168 hours  Radiological Exams on Admission: Dg Chest 2 View  04/04/2012  *RADIOLOGY REPORT*  Clinical Data: Diaphoresis.  CHEST - 2 VIEW  Comparison: Chest radiograph performed 05/24/2010  Findings: The lungs are well-aerated.  Minimal left basilar atelectasis or scarring is noted.  There is no evidence of focal opacification, pleural effusion or pneumothorax.  The cardiomediastinal silhouette is normal in size;  calcification is noted at the aortic arch.  No acute osseous abnormalities are seen.  Mild degenerative change is noted at the right acromioclavicular joint.  IMPRESSION: Minimal left basilar atelectasis or scarring noted; lungs otherwise grossly clear.   Original Report Authenticated By: Tonia Ghent, M.D.     EKG: Independently reviewed.  Assessment/Plan Principal Problem:  *Pre-syncope Active Problems:  AORTIC STENOSIS  Nausea & vomiting   1. Pre-syncope - almost certianly vaso-vagal given the patients story of episode associated with N/V and BM, will admit for the last troponin as a cardiac rule out, 2D echo ordered to make sure AS had nothing to do with it, but doubtful that this is new onset ACS. 2. Aortic Stenosis - will re-evaluate this with 2D echo. 3. N/V - resolved at time of admission, will address if this returns during hospital stay.    Code Status: Full Code (must indicate code status--if unknown or must be presumed, indicate so) Family Communication: no family in room (indicate person spoken with, if applicable, with phone number if by telephone) Disposition Plan: Admit to obs (indicate anticipated LOS)  Time spent:  GARDNER, JARED M. [facility] Pager [phone]  If 7PM-7AM, please contact night-coverage [email or web address] Password [email or web address] 04/05/2012, 3:04 AM

## 2012-04-05 NOTE — Progress Notes (Addendum)
[facility] PROGRESS NOTE  Assessment/Plan: Pre-syncope (04/05/2012): - h/o AS valve area on last echo 2011 showed valve area of 1.09. Her age might limit any surgical procedure. - cardiac marker negative. EKG sinus rhythm. - no appreciated 2nd heart sound, which is compatible with severe AS. She has been having episodes of dizzy spell for month. Confirm with Echo 12.29.2013.  Nausea & vomiting (04/05/2012) - resolved 2/2 to above.  Code Status: Full Code (must indicate code status--if unknown or must be presumed, indicate so)  Family Communication: no family in room (indicate person spoken with, if applicable, with phone number if by telephone)  Disposition Plan: Admit to obs (indicate anticipated LOS)   Consultants:  none  Procedures:  Echo12.29.2013 pending  Antibiotics:  none (indicate start date, and stop date if known)  HPI/Subjective: No complains  Objective: Filed Vitals:   04/05/12 0115 04/05/12 0145 04/05/12 0245 04/05/12 0500  BP: 124/65 106/53 148/64 138/65  Pulse: 85 77 82 85  Temp:    98.2 F (36.8 C)  TempSrc:      Resp: 17 18 17 18   Height:    5\' 4"  (1.626 m)  Weight:    55.021 kg (121 lb 4.8 oz)  SpO2: 96% 97% 97% 100%   No intake or output data in the 24 hours ending 04/05/12 0738 Filed Weights   04/04/12 1949 04/05/12 0500  Weight: 54.432 kg (120 lb) 55.021 kg (121 lb 4.8 oz)    Exam:  General: Alert, awake, oriented x3, in no acute distress.  HEENT: No bruits, no goiter.  Heart: Regular rate and rhythm, NO appreciated 2nd heart sound. Lungs: Good air movement, bilateral air movement.  Abdomen: Soft, nontender, nondistended, positive bowel sounds.  Neuro: Grossly intact, nonfocal.   Data Reviewed: Basic Metabolic Panel:  Lab 04/04/12 1610  NA 139  K 4.3  CL 102  CO2 --  GLUCOSE 121*  BUN 21  CREATININE 1.10  CALCIUM --  MG --  PHOS --   Liver Function Tests: No results found for this basename:  AST:5,ALT:5,ALKPHOS:5,BILITOT:5,PROT:5,ALBUMIN:5 in the last 168 hours No results found for this basename: LIPASE:5,AMYLASE:5 in the last 168 hours No results found for this basename: AMMONIA:5 in the last 168 hours CBC:  Lab 04/04/12 2034  WBC --  NEUTROABS --  HGB 14.3  HCT 42.0  MCV --  PLT --   Cardiac Enzymes:  Lab 04/04/12 2219  CKTOTAL --  CKMB --  CKMBINDEX --  TROPONINI <0.30   BNP (last 3 results) No results found for this basename: PROBNP:3 in the last 8760 hours CBG: No results found for this basename: GLUCAP:5 in the last 168 hours  No results found for this or any previous visit (from the past 240 hour(s)).   Studies: Dg Chest 2 View  04/04/2012  *RADIOLOGY REPORT*  Clinical Data: Diaphoresis.  CHEST - 2 VIEW  Comparison: Chest radiograph performed 05/24/2010  Findings: The lungs are well-aerated.  Minimal left basilar atelectasis or scarring is noted.  There is no evidence of focal opacification, pleural effusion or pneumothorax.  The cardiomediastinal silhouette is normal in size; calcification is noted at the aortic arch.  No acute osseous abnormalities are seen.  Mild degenerative change is noted at the right acromioclavicular joint.  IMPRESSION: Minimal left basilar atelectasis or scarring noted; lungs otherwise grossly clear.   Original Report Authenticated By: Tonia Ghent, M.D.     Scheduled Meds:   . amLODipine  5 mg Oral Daily  . heparin  5,000 Units Subcutaneous Q8H  . multivitamin with minerals  1 tablet Oral Daily  . oxybutynin  1 patch Transdermal Q72H  . sodium chloride  3 mL Intravenous Q12H  . sodium chloride  3 mL Intravenous Q12H   Continuous Infusions:    Marinda Elk  [facility] Pager [phone].  If 8PM-8AM, please contact night-coverage at [email or web address], password [facility] 04/05/2012, 7:38 AM  LOS: 1 day

## 2012-04-06 DIAGNOSIS — I059 Rheumatic mitral valve disease, unspecified: Secondary | ICD-10-CM

## 2012-04-06 NOTE — Discharge Summary (Signed)
Physician Discharge Summary  [name] UJW:[id number] DOB: 1921/12/24 DOA: 04/04/2012  PCP: Tillman Abide, MD  Admit date: 04/04/2012 Discharge date: 04/06/2012  Time spent: 40 minutes  Recommendations for Outpatient Follow-up:  Pt stable and ready for discharge to home. Recommend follow up with PCP 1 week  Discharge Diagnoses:  Principal Problem:  *Pre-syncope Active Problems:  AORTIC STENOSIS  Nausea & vomiting   Discharge Condition: stable  Diet recommendation: heart healthy  Filed Weights   04/04/12 1949 04/05/12 0500 04/06/12 0600  Weight: 54.432 kg (120 lb) 55.021 kg (121 lb 4.8 oz) 54.114 kg (119 lb 4.8 oz)    History of present illness:  [name] is a very pleasant 78 y.o. female who was feeling well until just after she finished [place] 04[place]05[place]12. She went to the bathroom where she had significant nausea while having a BM on the toilet. After this she was too dizzy to get back up she said she did not actually loose consciousness. As a result of this she went to the ED.  After arrival to the ED she had 1 episode of actual vomiting after which she felt much better and has felt better since that time. Because of her symptoms, hospitalist asked to admit as a rule out cardiac.      Hospital Course:  Pre-syncope (04/05/2012): -pt admitted to tele.  -likely vasovagal - h/o AS valve area on last echo 2011 showed valve area of 1.09. Her age might limit any surgical procedure. Echo repeated yielded EF 55-60% with mild to moderate AS.  - cardiac markers negative. EKG remained sinus rhythm. \ -orthostatics normal - no further episodes during this hospitalization -recommend follow up with PCP 1 week - Nausea & vomiting (04/05/2012) - resolved 2/2 to above. -provided with anti-emetic -no further vomiting during this hospitalization -tolerating diet well.        Procedures:  none  Consultations:  none  Discharge Exam: Filed Vitals:   04/06/12 1105 04/06/12 1138 04/06/12 1141 04/06/12 1144  BP: 139/60 131/59 146/55 155/71  Pulse: 83     Temp:      TempSrc:      Resp:      Height:      Weight:      SpO2: 98%       General: awake alert oriented x3 Cardiovascular: RRR +murmur No LEE PPP Respiratory: normal effort BSCTAB No wheeze/rhonchi  Discharge Instructions  Discharge Orders    Future Orders Please Complete By Expires   Diet - low sodium heart healthy      Increase activity slowly      Call MD for:  persistant nausea and vomiting      Call MD for:  difficulty breathing, headache or visual disturbances      Call MD for:  persistant dizziness or light-headedness          Medication List     As of 04/06/2012  2:11 PM    TAKE these medications         amLODipine 5 MG tablet   Commonly known as: NORVASC   Take 5 mg by mouth daily.      COD LIVER OIL PO   Take 1 tablet by mouth daily.      FP GLUCOSAMINE PO   Take 1 tablet by mouth daily.      multivitamin with minerals Tabs   Take 1 tablet by mouth daily. [employer] A-Z formula  OXYTROL FOR WOMEN TD   Place 1 patch onto the skin once a week. Change patch every 4 days           Follow-up Information    Follow up with Tillman Abide, MD. In 1 week.   Contact information:   [address] [place] [address] [phone]           The results of significant diagnostics from this hospitalization (including imaging, microbiology, ancillary and laboratory) are listed below for reference.    Significant Diagnostic Studies: Dg Chest 2 View  04/04/2012  *RADIOLOGY REPORT*  Clinical Data: Diaphoresis.  CHEST - 2 VIEW  Comparison: Chest radiograph performed 05/24/2010  Findings: The lungs are well-aerated.  Minimal left basilar atelectasis or scarring is noted.  There is no evidence of focal opacification, pleural effusion or pneumothorax.   The cardiomediastinal silhouette is normal in size; calcification is noted at the aortic arch.  No acute osseous abnormalities are seen.  Mild degenerative change is noted at the right acromioclavicular joint.  IMPRESSION: Minimal left basilar atelectasis or scarring noted; lungs otherwise grossly clear.   Original Report Authenticated By: Tonia Ghent, M.D.     Microbiology: No results found for this or any previous visit (from the past 240 hour(s)).   Labs: Basic Metabolic Panel:  Lab 04/05/12 9147 04/04/12 2034  NA 139 139  K 4.2 4.3  CL 101 102  CO2 27 --  GLUCOSE 125* 121*  BUN 23 21  CREATININE 0.71 1.10  CALCIUM 9.4 --  MG -- --  PHOS -- --   Liver Function Tests: No results found for this basename: AST:5,ALT:5,ALKPHOS:5,BILITOT:5,PROT:5,ALBUMIN:5 in the last 168 hours No results found for this basename: LIPASE:5,AMYLASE:5 in the last 168 hours No results found for this basename: AMMONIA:5 in the last 168 hours CBC:  Lab 04/05/12 0817 04/04/12 2034  WBC 6.6 --  NEUTROABS -- --  HGB 12.4 14.3  HCT 38.3 42.0  MCV 95.5 --  PLT 249 --   Cardiac Enzymes:  Lab 04/05/12 0848 04/05/12 0817 04/04/12 2219  CKTOTAL -- -- --  CKMB -- -- --  CKMBINDEX -- -- --  TROPONINI <0.30 <0.30 <0.30   BNP: BNP (last 3 results) No results found for this basename: PROBNP:3 in the last 8760 hours CBG: No results found for this basename: GLUCAP:5 in the last 168 hours     Signed:  Gwenyth Bender  [facility] 04/06/2012, 2:11 PM

## 2012-04-06 NOTE — Discharge Summary (Signed)
Patient seen and examined. Agree with note by Toya Smothers, NP. Patient will be discharged home today in stable condition. Her AS has not changed per ECHO. Suspect her pre-syncope was vasovagal in origen.  Peggye Pitt, MD [facility] Pager: [phone]

## 2012-04-06 NOTE — Progress Notes (Signed)
PT Cancellation Note  Patient Details Name: Kandas Oliveto MRN: [id number] DOB: [birth year]   Cancelled Treatment:    Reason Eval/Treat Not Completed: Patient at procedure or test/unavailable.  Pt in Nuc Med.     Sunny Schlein, [place] [phone] 04/06/2012, 9:21 AM

## 2012-04-06 NOTE — Progress Notes (Signed)
  Echocardiogram 2D Echocardiogram has been performed.  Ellender Hose A 04/06/2012, 10:26 AM

## 2012-04-06 NOTE — Evaluation (Signed)
Physical Therapy Evaluation Patient Details Name: Dayami Taitt MRN: [id number] DOB: 15-May-1921 Today's Date: 04/06/2012 Time: 5621-3086 PT Time Calculation (min): 9 min  PT Assessment / Plan / Recommendation Clinical Impression  pt presents with syncope.  pt moving well and seems to be at baseline.  pt's BP 147/60 upon coming to stand and remained 148/68 after ambulating.  No further PT needs at this time.      PT Assessment  Patent does not need any further PT services    Follow Up Recommendations  No PT follow up    Does the patient have the potential to tolerate intense rehabilitation      Barriers to Discharge        Equipment Recommendations  None recommended by PT    Recommendations for Other Services     Frequency      Precautions / Restrictions Precautions Precautions: Fall Restrictions Weight Bearing Restrictions: No   Pertinent Vitals/Pain Denies pain.        Mobility  Bed Mobility Bed Mobility: Not assessed Supine to Sit: 5: Supervision;HOB flat Sitting - Scoot to Edge of Bed: 5: Supervision Details for Bed Mobility Assistance: WFL  Transfers Transfers: Sit to Stand;Stand to Sit Sit to Stand: 6: Modified independent (Device/Increase time);With upper extremity assist;From chair/3-in-1;With armrests Stand to Sit: 6: Modified independent (Device/Increase time);With upper extremity assist;To chair/3-in-1;With armrests Details for Transfer Assistance: [facility] baseline Ambulation/Gait Ambulation/Gait Assistance: 6: Modified independent (Device/Increase time) Ambulation Distance (Feet): 250 Feet Assistive device: None Ambulation/Gait Assistance Details: pt moves slowly and with mild lateral sway, but demos Mod Independence.   Gait Pattern: Step-through pattern;Decreased stride length Stairs: Yes Stairs Assistance: 6: Modified independent (Device/Increase time) Stair Management Technique: One rail Right;Forwards Number of Stairs: 4  Wheelchair  Mobility Wheelchair Mobility: No    Shoulder Instructions     Exercises     PT Diagnosis:    PT Problem List:   PT Treatment Interventions:     PT Goals    Visit Information  Last PT Received On: 04/06/12 Assistance Needed: +1    Subjective Data  Subjective: I feel much better.   Patient Stated Goal: Home   Prior Functioning  Home Living Lives With: Alone Available Help at Discharge:  (Granddaughter can help) Type of Home: House Home Access: Stairs to enter [employer] of Steps: 2 Home Layout: One level;Able to live on main level with bedroom/bathroom Bathroom Shower/Tub: Tub/shower unit;Curtain Bathroom Toilet: Handicapped height Home Adaptive Equipment: Grab bars in shower;Walker - rolling;Straight cane;Walker - four wheeled;Wheelchair - manual;Wheelchair - powered;Bedside commode/3-in-1;Grab bars around toilet (all equipment was husbands) Prior Function Level of Independence: Independent Able to Take Stairs?: Yes Driving: Yes Vocation: Retired Musician: 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;HOH Dominant Hand: Right (eats and brushes teeth left handed)    Cognition  Overall Cognitive Status: Appears within functional limits for tasks assessed/performed Arousal/Alertness: Awake/alert Orientation Level: Appears intact for tasks assessed Behavior During Session: [facility] for tasks performed    Extremity/Trunk Assessment Right Upper Extremity Assessment RUE ROM/Strength/Tone: Within functional levels RUE Sensation: WFL - Light Touch RUE Coordination: WFL - gross/fine motor Left Upper Extremity Assessment LUE ROM/Strength/Tone: Within functional levels LUE Sensation: WFL - Light Touch LUE Coordination: WFL - gross/fine motor Right Lower Extremity Assessment RLE ROM/Strength/Tone: WFL for tasks assessed RLE Sensation: WFL - Light Touch Left Lower Extremity Assessment LLE ROM/Strength/Tone: WFL for tasks assessed LLE Sensation: WFL - Light Touch Trunk  Assessment Trunk Assessment: Normal   Balance Balance Balance Assessed: No  End of Session  PT - End of Session Equipment Utilized During Treatment: Gait belt Activity Tolerance: Patient tolerated treatment well Patient left: in chair;with call bell/phone within reach Nurse Communication: Mobility status  GP Functional Assessment Tool Used: Clinical Judgement Functional Limitation: Mobility: Walking and moving around Mobility: Walking and Moving Around Current Status (Z6109): 0 percent impaired, limited or restricted Mobility: Walking and Moving Around Goal Status (U0454): 0 percent impaired, limited or restricted Mobility: Walking and Moving Around Discharge Status [phone]): 0 percent impaired, limited or restricted   Sunny Schlein, [place] [phone] 04/06/2012, 2:23 PM

## 2012-04-06 NOTE — Evaluation (Signed)
Occupational Therapy Evaluation Patient Details Name: [name] MRN: [id number] DOB: 09-12-1921 Today's Date: 04/06/2012 Time: 4696-2952 OT Time Calculation (min): 21 min  OT Assessment / Plan / Recommendation Clinical Impression  78 yo female s/p syncopal epsiode that does not required skilled Ot acutely. Ot to sign off    OT Assessment  Patient does not need any further OT services    Follow Up Recommendations  No OT follow up    Barriers to Discharge      Equipment Recommendations       Recommendations for Other Services    Frequency       Precautions / Restrictions Precautions Precautions: Fall Restrictions Weight Bearing Restrictions: No   Pertinent Vitals/Pain No pain Supine HOB 56 degrees 131/59 Sitting feet down 146/55 Standing 155/71 3 minutes between each BP vital    ADL  Eating/Feeding: Modified independent Where Assessed - Eating/Feeding: Chair Grooming: Wash/dry hands;Modified independent Where Assessed - Grooming: Unsupported standing Upper Body Dressing: Modified independent Where Assessed - Upper Body Dressing: Unsupported sitting Toilet Transfer: Modified independent Toilet Transfer Method: Sit to stand Toilet Transfer Equipment: Raised toilet seat with arms (or 3-in-1 over toilet) Equipment Used: Gait belt Transfers/Ambulation Related to ADLs: pt moving Mod I. pt furniture walks at baseline.  ADL Comments: pt completed bed mobility grooming and transfer at baseline. No acute Ot needs. PT Megan informed of baseline during session and orthostatic BP stable. pt provided BP medication at end of session. Pt to check on patient s/p 1 hour and blood pressure medication    OT Diagnosis:    OT Problem List:   OT Treatment Interventions:     OT Goals    Visit Information  Last OT Received On: 04/06/12 Assistance Needed: +1    Subjective Data  Subjective: "I have it setup still like my husband used it and now i use it" pt with installed grab  bars and furniture walks Patient Stated Goal: to go home soon   Prior Functioning     Home Living Lives With: Alone Available Help at Discharge:  (Granddaughter can help) Type of Home: House Home Access: Stairs to enter [employer] of Steps: 2 Home Layout: One level;Able to live on main level with bedroom/bathroom Bathroom Shower/Tub: Tub/shower unit;Curtain Bathroom Toilet: Handicapped height Home Adaptive Equipment: Grab bars in shower;Walker - rolling;Straight cane;Walker - four wheeled;Wheelchair - manual;Wheelchair - powered;Bedside commode/3-in-1;Grab bars around toilet Prior Function Level of Independence: Independent Able to Take Stairs?: Yes Driving: Yes Vocation: Retired Musician: 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;HOH Dominant Hand: Right         Vision/Perception     Cognition  Overall Cognitive Status: Appears within functional limits for tasks assessed/performed Arousal/Alertness: Awake/alert Orientation Level: Appears intact for tasks assessed Behavior During Session: [facility] for tasks performed    Extremity/Trunk Assessment Right Upper Extremity Assessment RUE ROM/Strength/Tone: Within functional levels RUE Sensation: WFL - Light Touch RUE Coordination: WFL - gross/fine motor Left Upper Extremity Assessment LUE ROM/Strength/Tone: Within functional levels LUE Sensation: WFL - Light Touch LUE Coordination: WFL - gross/fine motor Trunk Assessment Trunk Assessment: Normal     Mobility Bed Mobility Bed Mobility: Supine to Sit;Sitting - Scoot to Edge of Bed Supine to Sit: 5: Supervision;HOB flat Sitting - Scoot to Edge of Bed: 5: Supervision Details for Bed Mobility Assistance: [facility]  Transfers Transfers: Sit to Stand;Stand to Sit Sit to Stand: 6: Modified independent (Device/Increase time);From bed Stand to Sit: 6: Modified independent (Device/Increase time);To chair/3-in-1;With upper extremity assist Details for Transfer Assistance:  WFL  baseline     Shoulder Instructions     Exercise     Balance     End of Session OT - End of Session Activity Tolerance: Patient tolerated treatment well Patient left: in chair;with call bell/phone within reach Nurse Communication: Mobility status;Precautions  GO Functional Assessment Tool Used: clinical judgement Functional Limitation: Self care Self Care Current Status (A5409): 0 percent impaired, limited or restricted Self Care Goal Status (W1191): 0 percent impaired, limited or restricted Self Care Discharge Status [phone]): 0 percent impaired, limited or restricted   Lucile Shutters 04/06/2012, 1:07 PM Pager: [phone]

## 2012-05-05 ENCOUNTER — Encounter: Payer: Self-pay | Admitting: *Deleted

## 2012-05-05 ENCOUNTER — Encounter: Payer: Self-pay | Admitting: Cardiology

## 2012-05-08 ENCOUNTER — Ambulatory Visit (INDEPENDENT_AMBULATORY_CARE_PROVIDER_SITE_OTHER): Payer: Medicare Other | Admitting: Cardiology

## 2012-05-08 ENCOUNTER — Ambulatory Visit: Payer: Medicare Other | Admitting: Cardiology

## 2012-05-08 ENCOUNTER — Encounter: Payer: Self-pay | Admitting: Cardiology

## 2012-05-08 VITALS — BP 160/80 | HR 86 | Ht 64.0 in | Wt 123.8 lb

## 2012-05-08 DIAGNOSIS — E785 Hyperlipidemia, unspecified: Secondary | ICD-10-CM

## 2012-05-08 DIAGNOSIS — I10 Essential (primary) hypertension: Secondary | ICD-10-CM

## 2012-05-08 DIAGNOSIS — I359 Nonrheumatic aortic valve disorder, unspecified: Secondary | ICD-10-CM

## 2012-05-08 DIAGNOSIS — R55 Syncope and collapse: Secondary | ICD-10-CM

## 2012-05-08 NOTE — Progress Notes (Signed)
  HPI: 79 year old female for evaluation of dizziness/presyncope. Myoview in Oct 2011 showed an ejection fraction of 71% and normal perfusion. Patient admitted in December of 2013 following a dizzy episode while having a bowel movement. Episode felt to be vagally mediated. Cardiac markers negative. Echocardiogram in December 2013 showed an ejection fraction of 55-60%, grade 1 diastolic dysfunction, mild to moderate aortic stenosis with mean gradient of 21 mmHg, trace aortic insufficiency, mild mitral regurgitation and a trivial pericardial effusion. Because of the above cardiology asked to evaluate. Since discharge she has continued to have occasional lightheaded spells but not as severe. These typically occur with changing positions but occasionally without change. They last seconds and resolve spontaneously. She does not have exertional chest pain. She has some dyspnea on exertion but no orthopnea, PND or pedal edema. She has not had frank syncope.  Current Outpatient Prescriptions  Medication Sig Dispense Refill  . amLODipine (NORVASC) 5 MG tablet Take 5 mg by mouth daily.      . COD LIVER OIL PO Take 1 tablet by mouth daily.      . Glucosamine Sulfate (FP GLUCOSAMINE PO) Take 1 tablet by mouth daily.      . Multiple Vitamin (MULTIVITAMIN WITH MINERALS) TABS Take 1 tablet by mouth daily. [employer] A-Z formula      . Oxybutynin (OXYTROL FOR WOMEN TD) Place 1 patch onto the skin once a week. Change patch every 4 days        No Known Allergies  Past Medical History  Diagnosis Date  . Hypertension   . HYPERLIPIDEMIA   . CATARACTS   . AORTIC STENOSIS   . DIVERTICULAR DISEASE   . OSTEOARTHRITIS   . OSTEOPOROSIS     Past Surgical History  Procedure Date  . Hip fracture surgery     History   Social History  . Marital Status: Married    Spouse Name: N/A    Number of Children: N/A  . Years of Education: N/A   Occupational History  . Not on file.   Social History Main Topics  .  Smoking status: Never Smoker   . Smokeless tobacco: Never Used  . Alcohol Use: 0.6 oz/week    1 Glasses of wine per week  . Drug Use: No  . Sexually Active:    Other Topics Concern  . Not on file   Social History Narrative  . No narrative on file    No family history on file.  ROS: no fevers or chills, productive cough, hemoptysis, dysphasia, odynophagia, melena, hematochezia, dysuria, hematuria, rash, seizure activity, orthopnea, PND, pedal edema, claudication. Remaining systems are negative.  Physical Exam:   Blood pressure 160/80, pulse 86, height 5\' 4"  (1.626 m), weight 123 lb 12.8 oz (56.155 kg), SpO2 99.00%.  General:  Well developed/well nourished in NAD Skin warm/dry Patient not depressed No peripheral clubbing Back-normal HEENT-normal/normal eyelids Neck supple/normal carotid upstroke bilaterally; no bruits; no JVD; no thyromegaly chest - CTA/ normal expansion CV - RRR/normal S1 and S2; no rubs or gallops;  PMI nondisplaced, 3/6 systolic murmur left sternal border. S2 is not diminished. Abdomen -NT/ND, no HSM, no mass, + bowel sounds, no bruit 2+ femoral pulses, no bruits Ext-no edema, chords, 2+ DP Neuro-grossly nonfocal  ECG 04/04/2012-sinus rhythm, lateral T wave inversion, poor R wave progression.

## 2012-05-08 NOTE — Assessment & Plan Note (Addendum)
Continue present medications. She is orthostatic by pulse in the office today. If her symptoms continue we may discontinue her antihypertensive to allow her blood pressure to run higher to see if this improves her symptoms. Patient's blood pressure lying was 143/81 with a pulse of 83. Standing she was 163/89 with a pulse of 109.

## 2012-05-08 NOTE — Assessment & Plan Note (Signed)
Plan follow-up echoes in the future. 

## 2012-05-08 NOTE — Assessment & Plan Note (Signed)
Management per primary care. 

## 2012-05-08 NOTE — Patient Instructions (Addendum)
Your physician recommends that you schedule a follow-up appointment in: 8 WEEKS WITH DR Jens Som  Your physician has recommended that you wear an event monitor. Event monitors are medical devices that record the heart's electrical activity. Doctors most often [place] these monitors to diagnose arrhythmias. Arrhythmias are problems with the speed or rhythm of the heartbeat. The monitor is a small, portable device. You can wear one while you do your normal daily activities. This is usually used to diagnose what is causing palpitations/syncope (passing out).   INCREASE FLUID INTAKE

## 2012-05-08 NOTE — Assessment & Plan Note (Addendum)
Etiology unclear. Her event at the restaurant sounds to be orthostatic mediated with contribution from vagal component. More recent episodes with question orthostatic component. However she also has symptoms occasionally without change in position. Patient encouraged to maintain by mouth intake and increase fluid intake. I will arrange a monitor to more fully assess. Her aortic stenosis would not appear to be severe enough to cause these symptoms. I will see her back 8 weeks after her monitor to more fully evaluate.

## 2012-05-13 ENCOUNTER — Encounter (INDEPENDENT_AMBULATORY_CARE_PROVIDER_SITE_OTHER): Payer: Medicare Other

## 2012-05-13 ENCOUNTER — Telehealth: Payer: Self-pay | Admitting: *Deleted

## 2012-05-13 DIAGNOSIS — R55 Syncope and collapse: Secondary | ICD-10-CM

## 2012-05-13 NOTE — Telephone Encounter (Signed)
21 day event monitor placed on patient 05/13/12 TK

## 2012-06-22 ENCOUNTER — Telehealth: Payer: Self-pay | Admitting: *Deleted

## 2012-06-22 NOTE — Telephone Encounter (Signed)
Spoke with pt, aware monitor reviewed by dr crenhsaw shows sinus with PVC's

## 2012-07-02 ENCOUNTER — Ambulatory Visit (INDEPENDENT_AMBULATORY_CARE_PROVIDER_SITE_OTHER): Payer: Medicare Other | Admitting: Cardiology

## 2012-07-02 ENCOUNTER — Encounter: Payer: Self-pay | Admitting: Cardiology

## 2012-07-02 VITALS — BP 152/64 | HR 71 | Ht 64.0 in | Wt 118.8 lb

## 2012-07-02 DIAGNOSIS — I359 Nonrheumatic aortic valve disorder, unspecified: Secondary | ICD-10-CM

## 2012-07-02 DIAGNOSIS — E785 Hyperlipidemia, unspecified: Secondary | ICD-10-CM

## 2012-07-02 DIAGNOSIS — I10 Essential (primary) hypertension: Secondary | ICD-10-CM

## 2012-07-02 DIAGNOSIS — R55 Syncope and collapse: Secondary | ICD-10-CM

## 2012-07-02 NOTE — Assessment & Plan Note (Signed)
Felt to be orthostatic mediated. I have asked her to continue to drink fluids as much as possible. Her symptoms have improved. Discontinue Norvasc. Allow blood pressure to run higher to see if this helps her symptoms.

## 2012-07-02 NOTE — Assessment & Plan Note (Signed)
Patient's orthostatic symptoms have improved with increased by mouth intake but still present at times. Discontinue Norvasc. I will allow her blood pressure to run higher to see if this improves her orthostatic symptoms. As long as her systolic is less than 160 we will continue off of blood pressure medications.

## 2012-07-02 NOTE — Assessment & Plan Note (Signed)
Management per primary care. 

## 2012-07-02 NOTE — Assessment & Plan Note (Signed)
Plan followup echocardiogram one year.

## 2012-07-02 NOTE — Patient Instructions (Signed)
Your physician wants you to follow-up in: 6 MONTHS WITH DR Jens Som You will receive a reminder letter in the mail two months in advance. If you don't receive a letter, please call our office to schedule the follow-up appointment.   STOP AMLODIPINE

## 2012-07-02 NOTE — Progress Notes (Signed)
   HPI: Pleasant female for fu of dizziness/presyncope. Myoview in Oct 2011 showed an ejection fraction of 71% and normal perfusion. Patient admitted in December of 2013 following a dizzy episode while having a bowel movement. Episode felt to be vagally mediated. Cardiac markers negative. Echocardiogram in December 2013 showed an ejection fraction of 55-60%, grade 1 diastolic dysfunction, mild to moderate aortic stenosis with mean gradient of 21 mmHg, trace aortic insufficiency, mild mitral regurgitation and a trivial pericardial effusion. CardioNet in February of 2014 showed sinus rhythm with PVCs. Patient encouraged to increase by mouth fluid intake at time of previous visit. Since I last saw her, no dyspnea, chest pain, palpitations. Mild lightheadedness with changing positions at times but no syncope.   Current Outpatient Prescriptions  Medication Sig Dispense Refill  . amLODipine (NORVASC) 5 MG tablet Take 5 mg by mouth daily.      . COD LIVER OIL PO Take 1 tablet by mouth daily.      . Glucosamine Sulfate (FP GLUCOSAMINE PO) Take 1 tablet by mouth daily.      . Multiple Vitamin (MULTIVITAMIN WITH MINERALS) TABS Take 1 tablet by mouth daily. [employer] A-Z formula      . Oxybutynin (OXYTROL FOR WOMEN TD) Place 1 patch onto the skin once a week. Change patch every 4 days      . psyllium (METAMUCIL) 58.6 % powder Take 1 packet by mouth daily.       No current facility-administered medications for this visit.     Past Medical History  Diagnosis Date  . Hypertension   . HYPERLIPIDEMIA   . CATARACTS   . AORTIC STENOSIS   . DIVERTICULAR DISEASE   . OSTEOARTHRITIS   . OSTEOPOROSIS     Past Surgical History  Procedure Laterality Date  . Hip fracture surgery      History   Social History  . Marital Status: Married    Spouse Name: N/A    Number of Children: N/A  . Years of Education: N/A   Occupational History  . Not on file.   Social History Main Topics  . Smoking status: Never  Smoker   . Smokeless tobacco: Never Used  . Alcohol Use: 0.6 oz/week    1 Glasses of wine per week  . Drug Use: No  . Sexually Active:    Other Topics Concern  . Not on file   Social History Narrative  . No narrative on file    ROS: no fevers or chills, productive cough, hemoptysis, dysphasia, odynophagia, melena, hematochezia, dysuria, hematuria, rash, seizure activity, orthopnea, PND, pedal edema, claudication. Remaining systems are negative.  Physical Exam: Well-developed well-nourished in no acute distress.  Skin is warm and dry.  HEENT is normal.  Neck is supple.  Chest is clear to auscultation with normal expansion.  Cardiovascular exam is regular rate and rhythm. 3/6 systolic murmur left border. Abdominal exam nontender or distended. No masses palpated. Extremities show no edema. neuro grossly intact  ECG sinus rhythm at a rate of 71. Right axis deviation. RV conduction delay. Nonspecific ST changes. LVH.

## 2013-01-18 ENCOUNTER — Encounter: Payer: Self-pay | Admitting: Cardiology

## 2013-05-06 ENCOUNTER — Emergency Department: Payer: Self-pay | Admitting: Internal Medicine

## 2013-05-06 LAB — COMPREHENSIVE METABOLIC PANEL
ALT: 22 U/L (ref 12–78)
AST: 14 U/L — AB (ref 15–37)
Albumin: 3.8 g/dL (ref 3.4–5.0)
Alkaline Phosphatase: 88 U/L
Anion Gap: 5 — ABNORMAL LOW (ref 7–16)
BILIRUBIN TOTAL: 0.6 mg/dL (ref 0.2–1.0)
BUN: 17 mg/dL (ref 7–18)
CALCIUM: 8.7 mg/dL (ref 8.5–10.1)
CO2: 26 mmol/L (ref 21–32)
Chloride: 101 mmol/L (ref 98–107)
Creatinine: 0.68 mg/dL (ref 0.60–1.30)
EGFR (African American): >60
EGFR (Non-African Amer.): >60
GLUCOSE: 95 mg/dL (ref 65–99)
Osmolality: 266 (ref 275–301)
Potassium: 4.3 mmol/L (ref 3.5–5.1)
Sodium: 132 mmol/L — ABNORMAL LOW (ref 136–145)
TOTAL PROTEIN: 7.4 g/dL (ref 6.4–8.2)

## 2013-05-06 LAB — CBC WITH DIFFERENTIAL/PLATELET
BASOS ABS: 0.1 10*3/uL (ref 0.0–0.1)
BASOS PCT: 0.5 %
Eosinophil #: 0 10*3/uL (ref 0.0–0.7)
Eosinophil %: 0.2 %
HCT: 39.1 % (ref 35.0–47.0)
HGB: 13 g/dL (ref 12.0–16.0)
LYMPHS ABS: 1.2 10*3/uL (ref 1.0–3.6)
LYMPHS PCT: 10.5 %
MCH: 30.9 pg (ref 26.0–34.0)
MCHC: 33.2 g/dL (ref 32.0–36.0)
MCV: 93 fL (ref 80–100)
MONOS PCT: 8.2 %
Monocyte #: 0.9 x10 3/mm (ref 0.2–0.9)
NEUTROS ABS: 9 10*3/uL — AB (ref 1.4–6.5)
NEUTROS PCT: 80.6 %
Platelet: 206 10*3/uL (ref 150–440)
RBC: 4.2 10*6/uL (ref 3.80–5.20)
RDW: 13.2 % (ref 11.5–14.5)
WBC: 11.2 10*3/uL — ABNORMAL HIGH (ref 3.6–11.0)

## 2013-05-06 LAB — LIPASE, BLOOD: Lipase: 157 U/L (ref 73–393)

## 2014-02-11 IMAGING — CR DG CHEST 2V
3 series · 3 of 3 positions shown · non-contrast
Comparison: Chest radiograph performed 05/24/2010

CLINICAL DATA: Diaphoresis.

CHEST - 2 VIEW

[w chest lat (1 of 2)]
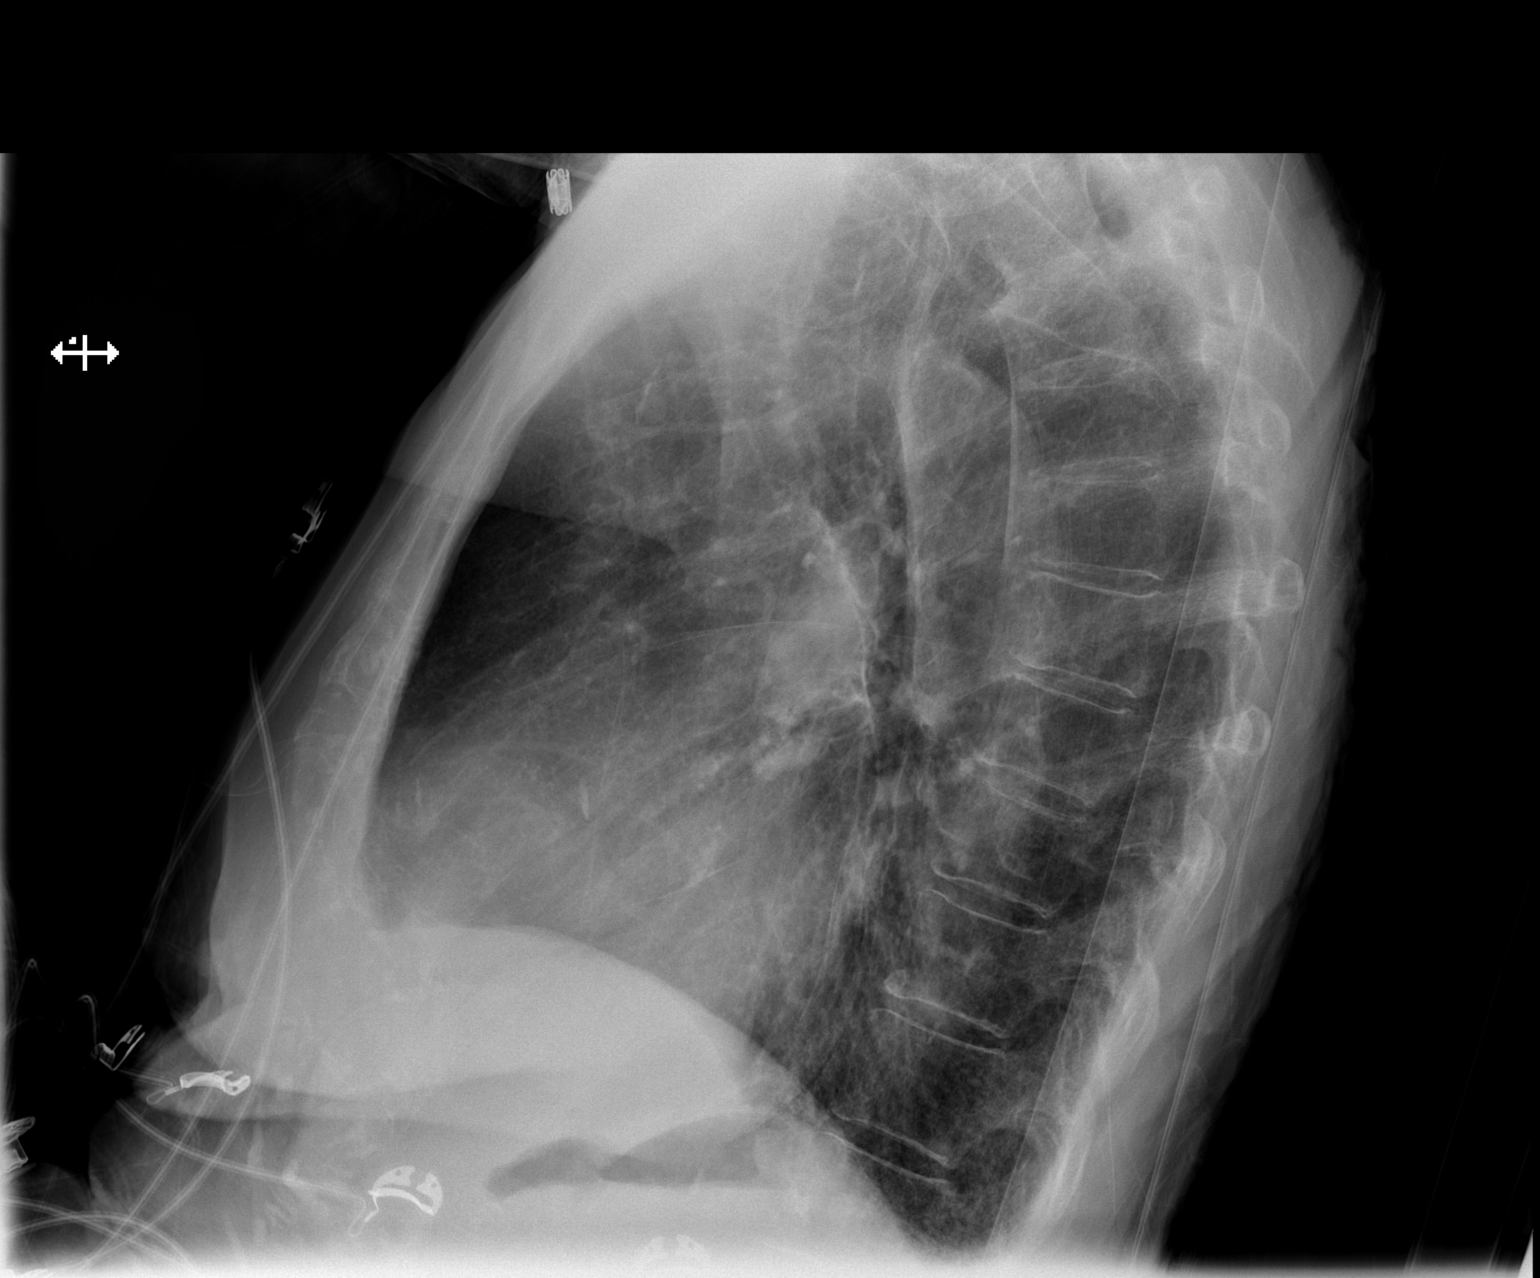

[w chest lat (2 of 2)]
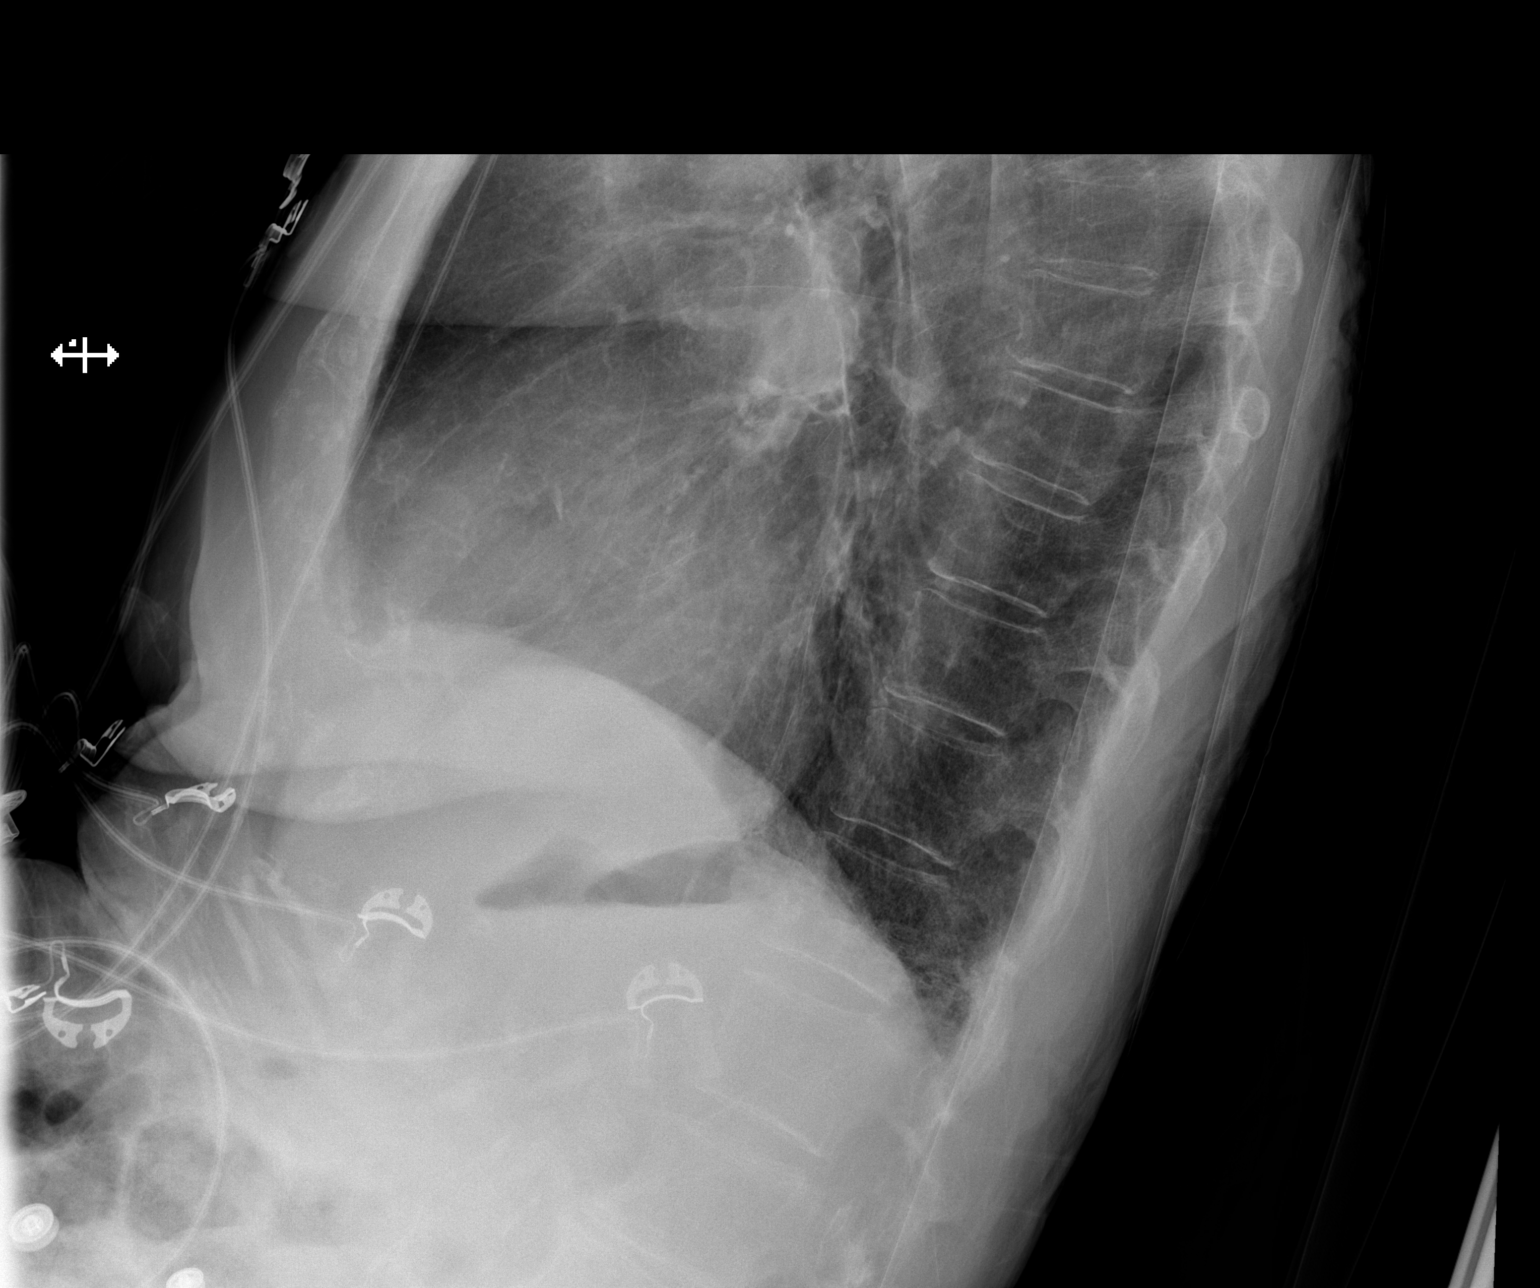

[x chest ap]
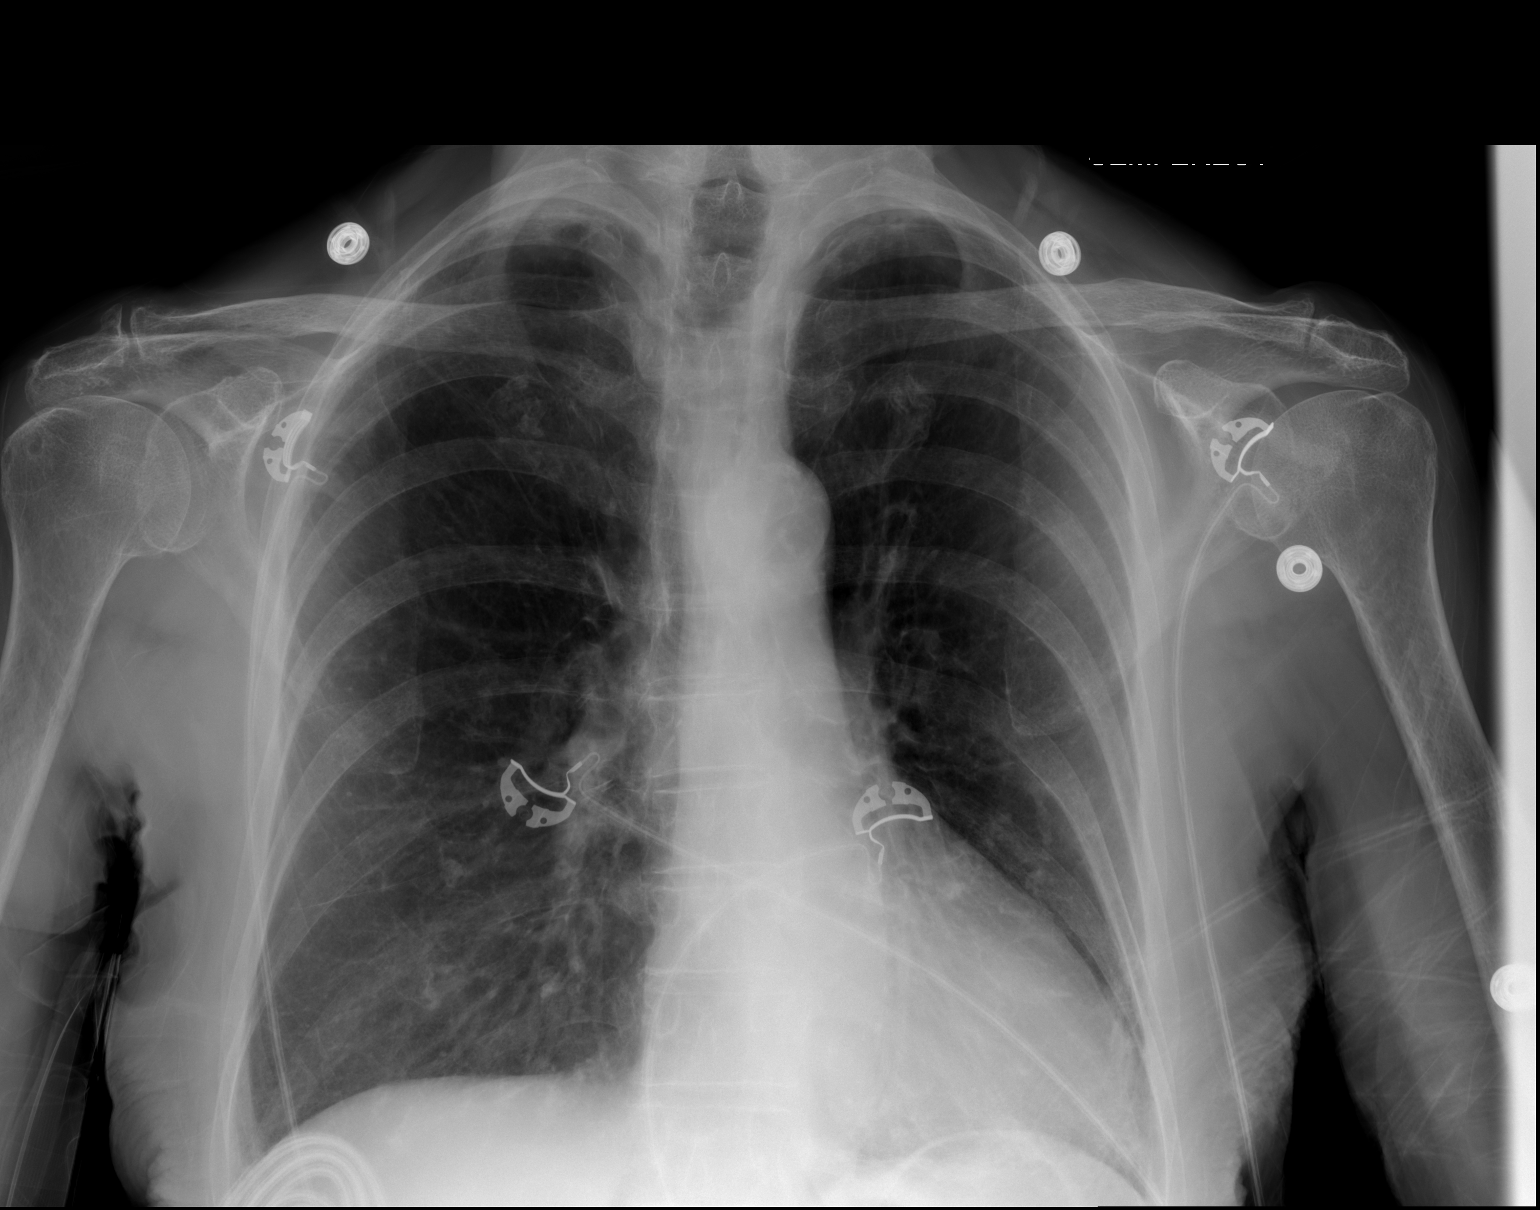

[3 of 3 positions shown; findings below may reference images not displayed]

FINDINGS: The lungs are well-aerated.  Minimal left basilar
atelectasis or scarring is noted.  There is no evidence of focal
opacification, pleural effusion or pneumothorax.

The cardiomediastinal silhouette is normal in size; calcification
is noted at the aortic arch.  No acute osseous abnormalities are
seen.  Mild degenerative change is noted at the right
acromioclavicular joint.
IMPRESSION: Minimal left basilar atelectasis or scarring noted; lungs otherwise
grossly clear.

## 2015-11-01 ENCOUNTER — Emergency Department (HOSPITAL_COMMUNITY)
Admission: EM | Admit: 2015-11-01 | Discharge: 2015-11-01 | Disposition: A | Discharge status: Home / Self Care | Payer: Medicare Other | Attending: Emergency Medicine | Admitting: Emergency Medicine

## 2015-11-01 ENCOUNTER — Encounter (HOSPITAL_COMMUNITY): Payer: Self-pay | Admitting: Emergency Medicine

## 2015-11-01 DIAGNOSIS — R110 Nausea: Secondary | ICD-10-CM

## 2015-11-01 DIAGNOSIS — I10 Essential (primary) hypertension: Secondary | ICD-10-CM | POA: Diagnosis not present

## 2015-11-01 DIAGNOSIS — R5381 Other malaise: Secondary | ICD-10-CM

## 2015-11-01 DIAGNOSIS — M1990 Unspecified osteoarthritis, unspecified site: Secondary | ICD-10-CM | POA: Insufficient documentation

## 2015-11-01 DIAGNOSIS — E860 Dehydration: Secondary | ICD-10-CM | POA: Diagnosis not present

## 2015-11-01 DIAGNOSIS — R5383 Other fatigue: Secondary | ICD-10-CM | POA: Diagnosis not present

## 2015-11-01 DIAGNOSIS — Z79899 Other long term (current) drug therapy: Secondary | ICD-10-CM | POA: Insufficient documentation

## 2015-11-01 DIAGNOSIS — E785 Hyperlipidemia, unspecified: Secondary | ICD-10-CM | POA: Insufficient documentation

## 2015-11-01 DIAGNOSIS — R112 Nausea with vomiting, unspecified: Secondary | ICD-10-CM | POA: Diagnosis present

## 2015-11-01 LAB — CBC
HEMATOCRIT: 37.2 % (ref 36.0–46.0)
Hemoglobin: 12.3 g/dL (ref 12.0–15.0)
MCH: 29.3 pg (ref 26.0–34.0)
MCHC: 33.1 g/dL (ref 30.0–36.0)
MCV: 88.6 fL (ref 78.0–100.0)
Platelets: 326 10*3/uL (ref 150–400)
RBC: 4.2 MIL/uL (ref 3.87–5.11)
RDW: 15 % (ref 11.5–15.5)
WBC: 5.2 10*3/uL (ref 4.0–10.5)

## 2015-11-01 LAB — URINALYSIS, ROUTINE W REFLEX MICROSCOPIC
Bilirubin Urine: NEGATIVE
GLUCOSE, UA: NEGATIVE mg/dL
HGB URINE DIPSTICK: NEGATIVE
KETONES UR: 15 mg/dL — AB
LEUKOCYTES UA: NEGATIVE
Nitrite: NEGATIVE
PH: 6.5 (ref 5.0–8.0)
Protein, ur: NEGATIVE mg/dL
Specific Gravity, Urine: 1.016 (ref 1.005–1.030)

## 2015-11-01 LAB — COMPREHENSIVE METABOLIC PANEL
ALBUMIN: 3.9 g/dL (ref 3.5–5.0)
ALT: 76 U/L — ABNORMAL HIGH (ref 14–54)
ANION GAP: 11 (ref 5–15)
AST: 68 U/L — ABNORMAL HIGH (ref 15–41)
Alkaline Phosphatase: 80 U/L (ref 38–126)
BUN: 14 mg/dL (ref 6–20)
CO2: 25 mmol/L (ref 22–32)
CREATININE: 0.89 mg/dL (ref 0.44–1.00)
Calcium: 9.2 mg/dL (ref 8.9–10.3)
Chloride: 93 mmol/L — ABNORMAL LOW (ref 101–111)
GFR calc non Af Amer: 55 mL/min — ABNORMAL LOW (ref >60)
GLUCOSE: 102 mg/dL — AB (ref 65–99)
POTASSIUM: 5.2 mmol/L — AB (ref 3.5–5.1)
SODIUM: 129 mmol/L — AB (ref 135–145)
Total Bilirubin: 1 mg/dL (ref 0.3–1.2)
Total Protein: 8 g/dL (ref 6.5–8.1)

## 2015-11-01 LAB — LIPASE, BLOOD: Lipase: 19 U/L (ref 11–51)

## 2015-11-01 MED ORDER — ONDANSETRON HCL 8 MG PO TABS: 8.0000 mg | ORAL_TABLET | Freq: Three times a day (TID) | ORAL | 0 refills | Status: AC | PRN

## 2015-11-01 NOTE — ED Provider Notes (Signed)
[facility]-EMERGENCY DEPT Provider Note   CSN: [id number] Arrival date & time: 11/01/15  1238  First Provider Contact:  16:45      History   Chief Complaint Chief Complaint  Patient presents with  . Emesis  . Weakness  . Back Pain    HPI [name] is a 82 y.o. female.  Friday and by family members for evaluation of nausea, weakness, decreased ability to ambulate, and decreased oral intake. To subacute in person since she was recently treated for a UTI with Septra. Treatment was started 8 days ago. She still has 2 pills left of the Septra because she was not able to take it yesterday. She is visiting family members for a prolonged stay, plans on returning home in a few months. She does not have a local PCP. Her mentation has been usual. There are no other known modifying factors.  HPI  Past Medical History:  Diagnosis Date  . AORTIC STENOSIS   . CATARACTS   . DIVERTICULAR DISEASE   . HYPERLIPIDEMIA   . Hypertension   . OSTEOARTHRITIS   . OSTEOPOROSIS     Patient Active Problem List   Diagnosis Date Noted  . Pre-syncope 04/05/2012  . Nausea & vomiting 04/05/2012  . SHORTNESS OF BREATH 01/12/2010  . HYPERLIPIDEMIA 01/11/2010  . CATARACTS 01/11/2010  . HYPERTENSION 01/11/2010  . AORTIC STENOSIS 01/11/2010  . DIVERTICULAR DISEASE 01/11/2010  . OSTEOARTHRITIS 01/11/2010  . OSTEOPOROSIS 01/11/2010    Past Surgical History:  Procedure Laterality Date  . HIP FRACTURE SURGERY      OB History    No data available       Home Medications    Prior to Admission medications   Medication Sig Start Date End Date Taking? Authorizing Provider  lisinopril (PRINIVIL,ZESTRIL) 5 MG tablet Take 5 mg by mouth 2 (two) times daily.   Yes Historical Provider, MD  ondansetron (ZOFRAN-ODT) 4 MG disintegrating tablet Take 4 mg by mouth every 6 (six) hours as needed for nausea or vomiting.   Yes Historical Provider, MD  sulfamethoxazole-trimethoprim (BACTRIM DS,SEPTRA DS) 800-160 MG  tablet Take 1 tablet by mouth 2 (two) times daily. 10/25/15 11/04/15 Yes Historical Provider, MD    Family History No family history on file.  Social History Social History  Substance Use Topics  . Smoking status: Never Smoker  . Smokeless tobacco: Never Used  . Alcohol use 0.6 oz/week    1 Glasses of wine per week     Allergies   Review of patient's allergies indicates no known allergies.   Review of Systems Review of Systems  All other systems reviewed and are negative.    Physical Exam Updated Vital Signs BP 177/85 (BP Location: Left Arm)   Pulse 82   Temp 98.2 F (36.8 C) (Oral)   Resp 17   Ht 5\' 4"  (1.626 m)   Wt 108 lb (49 kg)   SpO2 96%   BMI 18.54 kg/m   Physical Exam  Constitutional: She is oriented to person, place, and time. She appears well-developed. No distress.  Elderly, frail  HENT:  Head: Normocephalic and atraumatic.  Eyes: Conjunctivae are normal.  Neck: Neck supple.  Cardiovascular: Normal rate and regular rhythm.   No murmur heard. Pulmonary/Chest: Effort normal and breath sounds normal. No respiratory distress.  Abdominal: Soft. There is no tenderness.  Musculoskeletal: She exhibits no edema.  Neurological: She is alert and oriented to person, place, and time.  Skin: Skin is warm and dry.  Psychiatric: She  has a normal mood and affect.  Nursing note and vitals reviewed.    ED Treatments / Results  Labs (all labs ordered are listed, but only abnormal results are displayed) Labs Reviewed  COMPREHENSIVE METABOLIC PANEL - Abnormal; Notable for the following:       Result Value   Sodium 129 (*)    Potassium 5.2 (*)    Chloride 93 (*)    Glucose, Bld 102 (*)    AST 68 (*)    ALT 76 (*)    GFR calc non Af Amer 55 (*)    All other components within normal limits  LIPASE, BLOOD  CBC  URINALYSIS, ROUTINE W REFLEX MICROSCOPIC (NOT AT [facility])    EKG  EKG Interpretation None       Radiology No results  found.  Procedures Procedures (including critical care time)  Medications Ordered in ED Medications - No data to display   Initial Impression / Assessment and Plan / ED Course  I have reviewed the triage vital signs and the nursing notes.  Pertinent labs & imaging results that were available during my care of the patient were reviewed by me and considered in my medical decision making (see chart for details).  Clinical Course    Medications - No data to display  Patient Vitals for the past 24 hrs:  BP Temp Temp src Pulse Resp SpO2 Height Weight  11/01/15 1637 177/85 - - 82 17 96 %  (1.626 m) 108 lb (49 kg)  11/01/15 1329 185/85 98.2 F (36.8 C) Oral 104 18 96 % - -   Discussed with nursing, case management, who arranged for in-home services with PT, RN and aide.  6:44 PM Reevaluation with update and discussion. After initial assessment and treatment, an updated evaluation reveals She has been tolerating some oral nutrition, fluid and liquids here without vomiting. Family updated on findings and all questions were answered. [name]    Final Clinical Impressions(s) / ED Diagnoses   Final diagnoses:  Malaise and fatigue  Nausea  Dehydration   Nonspecific, nausea, and fatigue. Likely related to dehydration, and use of oral antibiotics. Today the urinalysis is normal and reassuring. Urine culture is pending. Symptoms of dehydration with mild electrolyte abnormalities. Her mentation is normal. Patient has good home support, treated at home with close monitoring. In-home health assessment with nursing, aide and physical therapy have been arranged.  Nursing Notes Reviewed/ Care Coordinated Applicable Imaging Reviewed Interpretation of Laboratory Data incorporated into ED treatment  The patient appears reasonably screened and/or stabilized for discharge and I doubt any other medical condition or other [facility] requiring further screening, evaluation, or treatment in the ED  at this time prior to discharge.  Plan: Home Medications- Stop Septra; Home Treatments- rest; return here if the recommended treatment, does not improve the symptoms; Recommended follow up- PCP 1 week   New Prescriptions New Prescriptions   No medications on file     Mancel Bale, MD 11/01/15 [phone]

## 2015-11-01 NOTE — Care Management Note (Signed)
Case Management Note  Patient Details  Name: [name] MRN: [id number] Date of Birth: 21-Aug-1921  Subjective/Objective:  Patient presents to Ed with fall from home, nausea.                  Action/Plan: Patient is from [place], currently staying at patient's daughter's house [address] [place] [address].  Family members concerned patient falling more frequently at home, balance issues.  Patient and family agreeable to home health services.  EDCM provided patient and family with list of home health agencies in [facility], explained services.  Patient's daughter has chosen [facility].  Patient reports she has a walker, rolator, shower bench and wheelchair at home.  Discussed with EDP.  [facility] faxed home health orders to [facility] with confirmation of receipt.   Expected Discharge Date:                  Expected Discharge Plan:  Home w Home Health Services  In-House Referral:     Discharge planning Services  CM Consult  Post Acute Care Choice:  Home Health Choice offered to:  Patient, Adult Children  DME Arranged:    DME Agency:     HH Arranged:  RN, PT, Nurse's Aide HH Agency:  [facility]  Status of Service:  Completed, signed off  If discussed at Long Length of Stay Meetings, dates discussed:    Additional CommentsRadford Pax, RN 11/01/2015, 6:54 PM

## 2015-11-01 NOTE — Discharge Instructions (Signed)
Drink 3 extra glasses of water each day.  Be careful when you get up and walk.  Home health services will come to your home to evaluate you.

## 2015-11-01 NOTE — ED Triage Notes (Addendum)
Patient c/o weakness, n/v that has gotten worse since fall this past weekend.  Patient was recently treated for UTI.  Patient fell this past weekend and c/o mid back pain.

## 2015-11-08 ENCOUNTER — Telehealth: Payer: Self-pay

## 2015-11-08 NOTE — Telephone Encounter (Signed)
I think I saw her years ago at [facility] but I am not her PCP I don't remember seeing her anytime recently (was she recently at [facility]?), so I can't approve any orders

## 2015-11-08 NOTE — Telephone Encounter (Signed)
Delacruz.Gulling PT with [facility] left v/m requesting verbal orders for home health PT eval and treat. Lynford Humphrey thinks Dr Alphonsus Sias is PCP. ?

## 2015-11-08 NOTE — Telephone Encounter (Signed)
Spoke to [name]. Advised her Dr Alphonsus Sias is not her PCP

## 2015-11-10 ENCOUNTER — Emergency Department (HOSPITAL_COMMUNITY)
Admission: EM | Admit: 2015-11-10 | Discharge: 2015-11-11 | Disposition: A | Discharge status: Home / Self Care | Payer: Medicare Other | Attending: Emergency Medicine | Admitting: Emergency Medicine

## 2015-11-10 ENCOUNTER — Emergency Department (HOSPITAL_COMMUNITY): Payer: Medicare Other

## 2015-11-10 ENCOUNTER — Encounter (HOSPITAL_COMMUNITY): Payer: Self-pay

## 2015-11-10 DIAGNOSIS — S0990XA Unspecified injury of head, initial encounter: Secondary | ICD-10-CM | POA: Diagnosis present

## 2015-11-10 DIAGNOSIS — Y92009 Unspecified place in unspecified non-institutional (private) residence as the place of occurrence of the external cause: Secondary | ICD-10-CM | POA: Diagnosis not present

## 2015-11-10 DIAGNOSIS — S0101XA Laceration without foreign body of scalp, initial encounter: Secondary | ICD-10-CM

## 2015-11-10 DIAGNOSIS — Y999 Unspecified external cause status: Secondary | ICD-10-CM | POA: Diagnosis not present

## 2015-11-10 DIAGNOSIS — W228XXA Striking against or struck by other objects, initial encounter: Secondary | ICD-10-CM | POA: Insufficient documentation

## 2015-11-10 DIAGNOSIS — Y939 Activity, unspecified: Secondary | ICD-10-CM | POA: Insufficient documentation

## 2015-11-10 DIAGNOSIS — R791 Abnormal coagulation profile: Secondary | ICD-10-CM | POA: Diagnosis not present

## 2015-11-10 DIAGNOSIS — Z79899 Other long term (current) drug therapy: Secondary | ICD-10-CM | POA: Insufficient documentation

## 2015-11-10 DIAGNOSIS — I951 Orthostatic hypotension: Secondary | ICD-10-CM

## 2015-11-10 DIAGNOSIS — I10 Essential (primary) hypertension: Secondary | ICD-10-CM | POA: Diagnosis not present

## 2015-11-10 LAB — URINALYSIS, ROUTINE W REFLEX MICROSCOPIC
Bilirubin Urine: NEGATIVE
Glucose, UA: NEGATIVE mg/dL
Hgb urine dipstick: NEGATIVE
KETONES UR: NEGATIVE mg/dL
NITRITE: NEGATIVE
PROTEIN: NEGATIVE mg/dL
Specific Gravity, Urine: 1.012 (ref 1.005–1.030)
pH: 6 (ref 5.0–8.0)

## 2015-11-10 LAB — DIFFERENTIAL
BASOS PCT: 1 %
Basophils Absolute: 0 10*3/uL (ref 0.0–0.1)
EOS ABS: 0.1 10*3/uL (ref 0.0–0.7)
EOS PCT: 1 %
LYMPHS PCT: 16 %
Lymphs Abs: 1.4 10*3/uL (ref 0.7–4.0)
MONO ABS: 1.1 10*3/uL — AB (ref 0.1–1.0)
Monocytes Relative: 13 %
Neutro Abs: 5.9 10*3/uL (ref 1.7–7.7)
Neutrophils Relative %: 69 %

## 2015-11-10 LAB — COMPREHENSIVE METABOLIC PANEL
ALK PHOS: 113 U/L (ref 38–126)
ALT: 17 U/L (ref 14–54)
ANION GAP: 8 (ref 5–15)
AST: 17 U/L (ref 15–41)
Albumin: 3.1 g/dL — ABNORMAL LOW (ref 3.5–5.0)
BUN: 19 mg/dL (ref 6–20)
CALCIUM: 8.8 mg/dL — AB (ref 8.9–10.3)
CO2: 26 mmol/L (ref 22–32)
Chloride: 97 mmol/L — ABNORMAL LOW (ref 101–111)
Creatinine, Ser: 0.78 mg/dL (ref 0.44–1.00)
GFR calc non Af Amer: >60 mL/min (ref >60)
Glucose, Bld: 106 mg/dL — ABNORMAL HIGH (ref 65–99)
Potassium: 4.4 mmol/L (ref 3.5–5.1)
SODIUM: 131 mmol/L — AB (ref 135–145)
Total Bilirubin: 0.6 mg/dL (ref 0.3–1.2)
Total Protein: 6.4 g/dL — ABNORMAL LOW (ref 6.5–8.1)

## 2015-11-10 LAB — URINE MICROSCOPIC-ADD ON: RBC / HPF: NONE SEEN RBC/hpf (ref 0–5)

## 2015-11-10 LAB — I-STAT TROPONIN, ED: Troponin i, poc: 0.08 ng/mL (ref 0.00–0.08)

## 2015-11-10 LAB — CBC
HCT: 33.7 % — ABNORMAL LOW (ref 36.0–46.0)
Hemoglobin: 10.8 g/dL — ABNORMAL LOW (ref 12.0–15.0)
MCH: 29.3 pg (ref 26.0–34.0)
MCHC: 32 g/dL (ref 30.0–36.0)
MCV: 91.3 fL (ref 78.0–100.0)
PLATELETS: 324 10*3/uL (ref 150–400)
RBC: 3.69 MIL/uL — ABNORMAL LOW (ref 3.87–5.11)
RDW: 14.7 % (ref 11.5–15.5)
WBC: 8.4 10*3/uL (ref 4.0–10.5)

## 2015-11-10 LAB — I-STAT CHEM 8, ED
BUN: 21 mg/dL — ABNORMAL HIGH (ref 6–20)
CHLORIDE: 95 mmol/L — AB (ref 101–111)
Calcium, Ion: 1.17 mmol/L (ref 1.12–1.23)
Creatinine, Ser: 0.9 mg/dL (ref 0.44–1.00)
Glucose, Bld: 104 mg/dL — ABNORMAL HIGH (ref 65–99)
HCT: 33 % — ABNORMAL LOW (ref 36.0–46.0)
HEMOGLOBIN: 11.2 g/dL — AB (ref 12.0–15.0)
Potassium: 4.7 mmol/L (ref 3.5–5.1)
SODIUM: 133 mmol/L — AB (ref 135–145)
TCO2: 28 mmol/L (ref 0–100)

## 2015-11-10 LAB — PROTIME-INR
INR: 1.05
PROTHROMBIN TIME: 13.7 s (ref 11.4–15.2)

## 2015-11-10 LAB — APTT: aPTT: 29 seconds (ref 24–36)

## 2015-11-10 MED ORDER — CEPHALEXIN 500 MG PO CAPS: 500.0000 mg | ORAL_CAPSULE | Freq: Three times a day (TID) | ORAL | 0 refills | Status: AC

## 2015-11-10 MED ORDER — DEXTROSE 5 % IV SOLN
1.0000 g | Freq: Once | INTRAVENOUS | Status: AC
  Administered 2015-11-10: 1 g via INTRAVENOUS
  Filled 2015-11-10: qty 10

## 2015-11-10 NOTE — ED Triage Notes (Signed)
Patient comes by EMS for a fall that happened at home.  Patient has a small laceration to the posterior left occipital region. No loss of consciousness.  A&Ox4 at this time.

## 2015-11-10 NOTE — ED Notes (Signed)
Patient stable for transport to [place] at this time

## 2015-11-10 NOTE — Discharge Instructions (Signed)
Decrease lisinopril to 5 mg once per day on.  Keflex prescription 10 days.  Staple removal in 10 days.  I recommend culture of her urine in 10 days after completion of her antibiotic for a "test of cure".  If her urine culture obtained tonight shows a resistant organism we will contact you in change it to an appropriate antibiotic  Encourage fluids.  TED hose. These can be obtained at any medical supply store.

## 2015-11-10 NOTE — ED Provider Notes (Signed)
[facility]-EMERGENCY DEPT Provider Note   CSN: [id number] Arrival date & time: 11/10/15  7494  First Provider Contact:  None       History   Chief Complaint Chief Complaint  Patient presents with  . Fall    HPI [name] is a 82 y.o. female.  Patient presents after a fall at home. She was sitting on a barstool with her legs dangling. She got up. She went to take a step or 2 to reach her walker. She fell backwards. She did not have loss of conscious. She struck her head. She states she felt lightheaded but did not have syncope. Complains of a mild headache.  She is here accompanied by her son. She normally lives in [place]. She is here on extended visit. She had an episode of weakness a few weeks ago and was found to have UTI at urgent care and was treated with fluids and antibiotics. Again had a fall and was seen at [facility] within the last week. Her urinary tract infection had resolved.  Only take lisinopril 5 mg twice a day. Does have history of aortic stenosis. No true exertional syncope.  HPI  Past Medical History:  Diagnosis Date  . AORTIC STENOSIS   . CATARACTS   . DIVERTICULAR DISEASE   . HYPERLIPIDEMIA   . Hypertension   . OSTEOARTHRITIS   . OSTEOPOROSIS     Patient Active Problem List   Diagnosis Date Noted  . Pre-syncope 04/05/2012  . Nausea & vomiting 04/05/2012  . SHORTNESS OF BREATH 01/12/2010  . HYPERLIPIDEMIA 01/11/2010  . CATARACTS 01/11/2010  . HYPERTENSION 01/11/2010  . AORTIC STENOSIS 01/11/2010  . DIVERTICULAR DISEASE 01/11/2010  . OSTEOARTHRITIS 01/11/2010  . OSTEOPOROSIS 01/11/2010    Past Surgical History:  Procedure Laterality Date  . HIP FRACTURE SURGERY      OB History    No data available       Home Medications    Prior to Admission medications   Medication Sig Start Date End Date Taking? Authorizing Provider  cephALEXin (KEFLEX) 500 MG capsule Take 1 capsule (500 mg total) by mouth 3 (three) times daily. 11/10/15   Rolland Porter, MD  lisinopril (PRINIVIL,ZESTRIL) 5 MG tablet Take 5 mg by mouth 2 (two) times daily.    Historical Provider, MD  ondansetron (ZOFRAN) 8 MG tablet Take 1 tablet (8 mg total) by mouth every 8 (eight) hours as needed for nausea or vomiting. 11/01/15   Mancel Bale, MD  ondansetron (ZOFRAN-ODT) 4 MG disintegrating tablet Take 4 mg by mouth every 6 (six) hours as needed for nausea or vomiting.    Historical Provider, MD    Family History History reviewed. No pertinent family history.  Social History Social History  Substance Use Topics  . Smoking status: Never Smoker  . Smokeless tobacco: Never Used  . Alcohol use 0.6 oz/week    1 Glasses of wine per week     Allergies   Ibuprofen   Review of Systems Review of Systems  Constitutional: Negative for appetite change, chills, diaphoresis, fatigue and fever.  HENT: Negative for mouth sores, sore throat and trouble swallowing.   Eyes: Negative for visual disturbance.  Respiratory: Negative for cough, chest tightness, shortness of breath and wheezing.   Cardiovascular: Negative for chest pain.  Gastrointestinal: Negative for abdominal distention, abdominal pain, diarrhea, nausea and vomiting.  Endocrine: Negative for polydipsia, polyphagia and polyuria.  Genitourinary: Negative for dysuria, frequency and hematuria.  Musculoskeletal: Negative for gait problem.  Skin: Positive for wound. Negative for color change, pallor and rash.  Neurological: Positive for dizziness. Negative for syncope, light-headedness and headaches.  Hematological: Does not bruise/bleed easily.  Psychiatric/Behavioral: Negative for behavioral problems and confusion.     Physical Exam Updated Vital Signs BP 142/68   Pulse 80   Temp 97.8 F (36.6 C)   Resp 18   SpO2 98%   Physical Exam  Constitutional: She is oriented to person, place, and time. She appears well-developed and well-nourished. No distress.  82 year old female. Awake alert Spry.  Slightly hard of hearing but able to offer details of her history and participate in her history and workup  HENT:  Head: Normocephalic.    Eyes: Conjunctivae are normal. Pupils are equal, round, and reactive to light. No scleral icterus.  Neck: Normal range of motion. Neck supple. No thyromegaly present.  Cardiovascular: Normal rate and regular rhythm.  Exam reveals no gallop and no friction rub.   No murmur heard. Pulmonary/Chest: Effort normal and breath sounds normal. No respiratory distress. She has no wheezes. She has no rales.  Regular rhythm. Sinus on the monitor. Clear bilateral breath sounds.  Abdominal: Soft. Bowel sounds are normal. She exhibits no distension. There is no tenderness. There is no rebound.  Musculoskeletal: Normal range of motion.  Neurological: She is alert and oriented to person, place, and time.  Awake alert. Moving all extremities.  Skin: Skin is warm and dry. No rash noted.  Psychiatric: She has a normal mood and affect. Her behavior is normal.     ED Treatments / Results  Labs (all labs ordered are listed, but only abnormal results are displayed) Labs Reviewed  CBC - Abnormal; Notable for the following:       Result Value   RBC 3.69 (*)    Hemoglobin 10.8 (*)    HCT 33.7 (*)    All other components within normal limits  DIFFERENTIAL - Abnormal; Notable for the following:    Monocytes Absolute 1.1 (*)    All other components within normal limits  COMPREHENSIVE METABOLIC PANEL - Abnormal; Notable for the following:    Sodium 131 (*)    Chloride 97 (*)    Glucose, Bld 106 (*)    Calcium 8.8 (*)    Total Protein 6.4 (*)    Albumin 3.1 (*)    All other components within normal limits  URINALYSIS, ROUTINE W REFLEX MICROSCOPIC (NOT AT [facility]) - Abnormal; Notable for the following:    APPearance CLOUDY (*)    Leukocytes, UA LARGE (*)    All other components within normal limits  URINE MICROSCOPIC-ADD ON - Abnormal; Notable for the following:     Squamous Epithelial / LPF 0-5 (*)    Bacteria, UA FEW (*)    All other components within normal limits  I-STAT CHEM 8, ED - Abnormal; Notable for the following:    Sodium 133 (*)    Chloride 95 (*)    BUN 21 (*)    Glucose, Bld 104 (*)    Hemoglobin 11.2 (*)    HCT 33.0 (*)    All other components within normal limits  URINE CULTURE  PROTIME-INR  APTT  I-STAT TROPOININ, ED  CBG MONITORING, ED    EKG  EKG Interpretation None       Radiology Ct Head Wo Contrast  Result Date: 11/10/2015 CLINICAL DATA:  82 year old female with fall and blurred vision. EXAM: CT HEAD WITHOUT CONTRAST TECHNIQUE: Contiguous axial images were obtained from the  base of the skull through the vertex without intravenous contrast. COMPARISON:  None. FINDINGS: There is mild age-related atrophy and chronic microvascular ischemic changes. A subcentimeter right basal ganglia and left cerebellar old lacunar infarcts noted. There is no acute intracranial hemorrhage. No mass effect or midline shift noted. The visualized paranasal sinuses and mastoid air cells are clear. The calvarium is intact. IMPRESSION: No acute intracranial hemorrhage. Mild age-related atrophy and chronic microvascular ischemic disease. If symptoms persist and there are no contraindications, MRI may provide better evaluation if clinically indicated Electronically Signed   By: Elgie Collard M.D.   On: 11/10/2015 21:20    Procedures Procedures (including critical care time)  Medications Ordered in ED Medications  cefTRIAXone (ROCEPHIN) 1 g in dextrose 5 % 50 mL IVPB (1 g Intravenous New Bag/Given 11/10/15 2303)     Initial Impression / Assessment and Plan / ED Course  I have reviewed the triage vital signs and the nursing notes.  Pertinent labs & imaging results that were available during my care of the patient were reviewed by me and considered in my medical decision making (see chart for details).  Clinical Course    Has recurrent UTI.  Culture pending. No previous culture. Family states she was perfused on Cipro. Will use Keflex and await culture. Is not anemic. Normal renal function. EKG without changes. Normal CT. I've asked him to decrease her lisinopril to 5 per day. Encourage fluids. Encouraged TED hose. Primary care follow-up is with equal. Recommended test of cure of urine after completion of antibiotics.  Final Clinical Impressions(s) / ED Diagnoses   Final diagnoses:  Scalp laceration, initial encounter  Orthostasis    New Prescriptions New Prescriptions   CEPHALEXIN (KEFLEX) 500 MG CAPSULE    Take 1 capsule (500 mg total) by mouth 3 (three) times daily.     Rolland Porter, MD 11/10/15 [phone]

## 2015-11-12 LAB — URINE CULTURE

## 2015-11-16 ENCOUNTER — Telehealth: Payer: Self-pay | Admitting: General Practice

## 2019-02-07 DEATH — deceased

## 2020-03-08 DEATH — deceased
